# Patient Record
Sex: Male | Born: 1935 | Race: White | Hispanic: No | Marital: Married | Smoking: Never smoker
Health system: Southern US, Community
[De-identification: ages and names within clinical notes are randomized; demographics above are authoritative.]

## PROBLEM LIST (undated history)

## (undated) DIAGNOSIS — L80 Vitiligo: Secondary | ICD-10-CM

## (undated) DIAGNOSIS — I2510 Atherosclerotic heart disease of native coronary artery without angina pectoris: Secondary | ICD-10-CM

## (undated) DIAGNOSIS — E7800 Pure hypercholesterolemia, unspecified: Secondary | ICD-10-CM

## (undated) HISTORY — PX: CORONARY ARTERY BYPASS GRAFT: SHX141

---

## 2013-05-09 ENCOUNTER — Emergency Department (HOSPITAL_COMMUNITY): Payer: Medicare Other

## 2013-05-09 ENCOUNTER — Emergency Department (HOSPITAL_COMMUNITY)
Admission: EM | Admit: 2013-05-09 | Discharge: 2013-05-09 | Disposition: A | Discharge status: Home / Self Care | Payer: Medicare Other | Attending: Emergency Medicine | Admitting: Emergency Medicine

## 2013-05-09 ENCOUNTER — Encounter (HOSPITAL_COMMUNITY): Payer: Self-pay | Admitting: Emergency Medicine

## 2013-05-09 DIAGNOSIS — Z79899 Other long term (current) drug therapy: Secondary | ICD-10-CM | POA: Insufficient documentation

## 2013-05-09 DIAGNOSIS — E785 Hyperlipidemia, unspecified: Secondary | ICD-10-CM | POA: Diagnosis present

## 2013-05-09 DIAGNOSIS — K649 Unspecified hemorrhoids: Secondary | ICD-10-CM

## 2013-05-09 DIAGNOSIS — I2510 Atherosclerotic heart disease of native coronary artery without angina pectoris: Secondary | ICD-10-CM

## 2013-05-09 DIAGNOSIS — E7800 Pure hypercholesterolemia, unspecified: Secondary | ICD-10-CM | POA: Insufficient documentation

## 2013-05-09 DIAGNOSIS — Z951 Presence of aortocoronary bypass graft: Secondary | ICD-10-CM | POA: Insufficient documentation

## 2013-05-09 DIAGNOSIS — Z7982 Long term (current) use of aspirin: Secondary | ICD-10-CM | POA: Insufficient documentation

## 2013-05-09 DIAGNOSIS — I10 Essential (primary) hypertension: Secondary | ICD-10-CM

## 2013-05-09 DIAGNOSIS — K625 Hemorrhage of anus and rectum: Secondary | ICD-10-CM | POA: Diagnosis present

## 2013-05-09 HISTORY — DX: Atherosclerotic heart disease of native coronary artery without angina pectoris: I25.10

## 2013-05-09 HISTORY — DX: Pure hypercholesterolemia, unspecified: E78.00

## 2013-05-09 LAB — URINALYSIS, ROUTINE W REFLEX MICROSCOPIC
Ketones, ur: NEGATIVE mg/dL
Protein, ur: NEGATIVE mg/dL
Urobilinogen, UA: 0.2 mg/dL (ref 0.0–1.0)

## 2013-05-09 LAB — CBC WITH DIFFERENTIAL/PLATELET
Basophils Relative: 0 % (ref 0–1)
Eosinophils Absolute: 0.2 10*3/uL (ref 0.0–0.7)
MCH: 29.9 pg (ref 26.0–34.0)
MCHC: 33.5 g/dL (ref 30.0–36.0)
Neutrophils Relative %: 49 % (ref 43–77)
Platelets: 128 10*3/uL — ABNORMAL LOW (ref 150–400)
RDW: 13.3 % (ref 11.5–15.5)

## 2013-05-09 LAB — COMPREHENSIVE METABOLIC PANEL
ALT: 14 U/L (ref 0–53)
Albumin: 3.6 g/dL (ref 3.5–5.2)
Alkaline Phosphatase: 63 U/L (ref 39–117)
BUN: 16 mg/dL (ref 6–23)
Potassium: 4.1 mEq/L (ref 3.5–5.1)
Sodium: 139 mEq/L (ref 135–145)
Total Protein: 7 g/dL (ref 6.0–8.3)

## 2013-05-09 LAB — PROTIME-INR: Prothrombin Time: 14.3 seconds (ref 11.6–15.2)

## 2013-05-09 LAB — TYPE AND SCREEN
ABO/RH(D): O POS
Antibody Screen: NEGATIVE

## 2013-05-09 LAB — URINE MICROSCOPIC-ADD ON

## 2013-05-09 MED ORDER — DOCUSATE SODIUM 100 MG PO CAPS: 100.0000 mg | ORAL_CAPSULE | Freq: Two times a day (BID) | ORAL | Status: DC

## 2013-05-09 MED ORDER — IOHEXOL 300 MG/ML  SOLN
50.0000 mL | Freq: Once | INTRAMUSCULAR | Status: AC | PRN
  Administered 2013-05-09: 50 mL via ORAL

## 2013-05-09 MED ORDER — HYDROCORTISONE ACETATE 25 MG RE SUPP: 25.0000 mg | Freq: Two times a day (BID) | RECTAL | Status: DC

## 2013-05-09 MED ORDER — HYDROCORTISONE 2.5 % RE CREA: TOPICAL_CREAM | RECTAL | Status: DC

## 2013-05-09 MED ORDER — IOHEXOL 300 MG/ML  SOLN
100.0000 mL | Freq: Once | INTRAMUSCULAR | Status: AC | PRN
  Administered 2013-05-09: 100 mL via INTRAVENOUS

## 2013-05-09 MED ORDER — PANTOPRAZOLE SODIUM 40 MG IV SOLR
40.0000 mg | Freq: Once | INTRAVENOUS | Status: AC
  Administered 2013-05-09: 40 mg via INTRAVENOUS
  Filled 2013-05-09: qty 40

## 2013-05-09 MED ORDER — IOHEXOL 300 MG/ML  SOLN: 50.0000 mL | Freq: Once | INTRAMUSCULAR | Status: DC | PRN

## 2013-05-09 NOTE — ED Notes (Signed)
States that he started having rectal bleeding with bowel movements yesterday morning and this morning.  Denies abdominal pain.

## 2013-05-09 NOTE — ED Provider Notes (Signed)
History  This chart was scribed for Randy Octave, MD by Randy Edwards, ED Scribe. This patient was seen in room APA09/APA09 and the patient's care was started at 9:48 AM.  CSN: [id number]  Arrival date & time 05/09/13  8657    Chief Complaint  Patient presents with  . Rectal Bleeding    The history is provided by the patient. No language interpreter was used.    HPI Comments: Randy Edwards is a 79 y.o. male with h/o CAD and high cholesterol who presents to the Emergency Department complaining of rectal bleeding that started yesterday morning with bowel movements. He states about half a cup full of blood comes out before he passes stool. He states his stool is bright red. Pt denies fever, neck pain, sore throat, visual disturbance, CP, cough, SOB, abdominal pain, nausea, emesis, diarrhea, urinary symptoms, back pain, HA, weakness, numbness and rash as associated symptoms. He states his right leg has been swollen for years and he has had it looked at. Pt states he takes an aspirin everyday. He states he had a colonoscopy less than a year ago.  CABG 2 months ago.  On ASA, no plavix.  Past Medical History  Diagnosis Date  . Coronary artery disease   . High cholesterol     Past Surgical History  Procedure Laterality Date  . Coronary artery bypass graft      No family history on file.  History  Substance Use Topics  . Smoking status: Not on file  . Smokeless tobacco: Not on file  . Alcohol Use: Not on file      Review of Systems  A complete 10 system review of systems was obtained and all systems are negative except as noted in the HPI and PMH.   Allergies  Review of patient's allergies indicates no known allergies.  Home Medications   Current Outpatient Rx  Name  Route  Sig  Dispense  Refill  . atorvastatin (LIPITOR) 10 MG tablet   Oral   Take 10 mg by mouth at bedtime.         Marland Kitchen aspirin EC 81 MG tablet   Oral   Take 81 mg by mouth daily.         . calcium  carbonate (OS-CAL) 600 MG TABS   Oral   Take 600 mg by mouth daily.         Marland Kitchen docusate sodium (COLACE) 100 MG capsule   Oral   Take 100 mg by mouth daily as needed for constipation.         . docusate sodium (COLACE) 100 MG capsule   Oral   Take 1 capsule (100 mg total) by mouth 2 (two) times daily.   30 capsule   0   . docusate sodium (COLACE) 100 MG capsule   Oral   Take 1 capsule (100 mg total) by mouth every 12 (twelve) hours.   60 capsule   0   . hydrocortisone (ANUSOL-HC) 2.5 % rectal cream      Apply rectally 2 times daily   30 g   0   . hydrocortisone (ANUSOL-HC) 25 MG suppository   Rectal   Place 1 suppository (25 mg total) rectally 2 (two) times daily.   12 suppository   0   . metoprolol (LOPRESSOR) 50 MG tablet   Oral   Take 50 mg by mouth 2 (two) times daily.         . Multiple Vitamin (MULTIVITAMIN WITH MINERALS) TABS  Oral   Take 1 tablet by mouth daily.           BP 153/78  Pulse 60  Temp(Src) 97.7 F (36.5 C) (Oral)  Resp 20  Ht 5\' 11"  (1.803 m)  Wt 212 lb (96.163 kg)  BMI 29.58 kg/m2  SpO2 100%  Physical Exam  Nursing note and vitals reviewed. Constitutional: He is oriented to person, place, and time. He appears well-developed and well-nourished. No distress.  HENT:  Head: Normocephalic and atraumatic.  Eyes: EOM are normal.  Neck: Neck supple. No tracheal deviation present.  Cardiovascular: Normal rate.   Pulmonary/Chest: Effort normal. No respiratory distress.  Abdominal: Soft. He exhibits no mass. There is no tenderness.  Genitourinary:  Small external hemorrhoids that are non thrombosed. brown pink tinged stool.   Musculoskeletal: Normal range of motion.  Asymmetric swelling of right leg (chronic per pt).   Neurological: He is alert and oriented to person, place, and time.  Skin: Skin is warm and dry.  Psychiatric: He has a normal mood and affect. His behavior is normal.    ED Course  Procedures (including critical  care time)  DIAGNOSTIC STUDIES: Oxygen Saturation is 100% on RA, normal by my interpretation.    COORDINATION OF CARE: 9:53 AM-Discussed treatment plan with pt at bedside and pt agreed to plan.   12:13 PM- Upon recheck, alerted family of possible admit to hospital.   Labs Reviewed  CBC WITH DIFFERENTIAL - Abnormal; Notable for the following:    Platelets 128 (*)    Monocytes Relative 13 (*)    All other components within normal limits  COMPREHENSIVE METABOLIC PANEL - Abnormal; Notable for the following:    Glucose, Bld 103 (*)    GFR calc non Af Amer 68 (*)    GFR calc Af Amer 78 (*)    All other components within normal limits  URINALYSIS, ROUTINE W REFLEX MICROSCOPIC - Abnormal; Notable for the following:    Specific Gravity, Urine <1.005 (*)    Hgb urine dipstick TRACE (*)    All other components within normal limits  PROTIME-INR  TROPONIN I  URINE MICROSCOPIC-ADD ON  TYPE AND SCREEN   Ct Abdomen Pelvis W Contrast  05/09/2013   *RADIOLOGY REPORT*  Clinical Data: Rectal bleeding.  Recent heart surgery.  CT ABDOMEN AND PELVIS WITH CONTRAST  Technique:  Multidetector CT imaging of the abdomen and pelvis was performed following the standard protocol during bolus administration of intravenous contrast.  Contrast: 50mL OMNIPAQUE IOHEXOL 300 MG/ML  SOLN, OMNIPAQUE IOHEXOL 300 MG/ML  SOLN  Comparison: None.  Findings: Lung bases:  Clear lung bases.  Mild cardiomegaly without pericardial effusion. Prior median sternotomy.  Bilateral pleural calcifications and plaques.  Abdomen/pelvis:  Normal liver, spleen, stomach, pancreas, gallbladder, biliary tract, adrenal glands.  9 mm left renal cyst.  Normal right kidney.  Aortic atherosclerosis. No retroperitoneal or retrocrural adenopathy.  Scattered colonic diverticula.  Normal terminal ileum and appendix. Normal small bowel without abdominal ascites.  No pelvic adenopathy.  Normal urinary bladder.  Mild prostatomegaly. No significant free  fluid.  Bones/Musculoskeletal:  No acute osseous abnormality.  Mild T12 vertebral body height loss.  IMPRESSION:  1. No acute process in the abdomen or pelvis. 2.  Findings of asbestos related pleural disease. 3. Trace T12 vertebral body height loss, likely chronic.   Original Report Authenticated By: Jeronimo Greaves, M.D.   US Venous Img Lower Unilateral Right  05/09/2013   *RADIOLOGY REPORT*  Clinical Data: Right leg  swelling  RIGHT LOWER EXTREMITY VENOUS DUPLEX ULTRASOUND  Technique:  Gray-scale sonography with graded compression, as well as color Doppler and duplex ultrasound, were performed to evaluate the deep venous system of the lower extremity from the level of the common femoral vein through the popliteal and proximal calf veins. Spectral Doppler was utilized to evaluate flow at rest and with distal augmentation maneuvers.  Comparison:  None.  Findings: The visualized right lower extremity deep venous system appears patent.  Normal compressibility.  Patent color Doppler flow.  Satisfactory spectral Doppler with respiratory variation and response to augmentation.  The greater saphenous vein, where visualized, is patent and compressible.  Subcutaneous edema in the calf.  IMPRESSION: No deep venous thrombosis in the visualized right lower extremity.  Subcutaneous edema in the calf.   Original Report Authenticated By: Charline Bills, M.D.     1. Rectal bleeding   2. CAD (coronary artery disease)   3. Hyperlipidemia   4. Hypertension   5. Hemorrhoids       MDM  Bright red blood per rectum this morning followed by bloody bowel movement. Small bloody bowel movement yesterday. States stool is brown with red mixed in. No abdominal pain, chest pain or shortness of breath. No dizziness lightheadedness. Reportedly negative colonoscopy one year ago  Hemoglobin 13.5, no comparison. Vital stable, no orthostasis. INR normal.  Doppler negative for DVT.  Abdomen soft and nontender.  Suspect rectal  bleeding related to hemorrhoids.  Patient states colonoscopy one year ago was negative but this result is not available.  No tachycardia or hypotension. No dizziness, lightheadedness. D/w Dr. Kerry Hough who has consulted and agrees patient is stable for outpatient followup.  He is advised to call his GI doctor in [place] this week.  He has been advised to return to the ED if he has recurrent/persistent or worsening bleeding, lightheadedness, generalized weakness, chest pain or SOB.   Date: 05/09/2013  Rate: 59  Rhythm: normal sinus rhythm  QRS Axis: normal  Intervals: normal  ST/T Wave abnormalities: normal  Conduction Disutrbances:none  Narrative Interpretation:   Old EKG Reviewed: none available     ,I personally performed the services described in this documentation, which was scribed in my presence. The recorded information has been reviewed and is accurate. Randy Octave, MD 05/09/13 [phone]

## 2013-05-09 NOTE — Consult Note (Signed)
[facility] Medical Consultation  Randy Edwards ZOX:[id number] DOB: Jul 11, 1930 DOA: 05/09/2013 PCP: Marjean Donna, MD   Requesting physician: Dr. Manus Gunning Date of consultation: 05/09/13 Reason for consultation: rectal bleeding  Impression/Recommendations Active Problems:   Rectal bleeding    1. Rectal bleeding.  Rectal exam was done by EDP and it was noted that patient had small external hemorrhoids.  By history, it appears that patient has had bleeding from his hemorrhoids.  Of course, other etiologies such as diverticular bleeding may be possible, but less likely in this situation.  Patient is hemodynamically stable (not hypotensive or tachycardic).  He does not have any symptoms of anemia.  His hemoglobin is normal at 13.5.  Patient reports that he can schedule close follow up with his primary gastroenterologist in [place], and he has been advised to call him tomorrow.  At this time, it appears that patient has been reasonably screened in the ED and felt safe to discharge home.  He has been advised to return to the ED if he has recurrent/persistent or worsening bleeding, lightheadedness, generalized weakness. His bleeding is likely hemorrhoidal and he will be prescribed colace and anusol suppositories. He will follow up with his primary gastroenterologist later this week. 2. LE edema, chronic, venous dopplers negative for DVT.  This can be followed as an outpatient. 3. CAD s/p CABG 2 months ago.  Follow up with cardiologist.  No angina at this time. 4. HTN.  Stable on lopressor 5. HLP, continue statin   Chief Complaint: Rectal bleeding  HPI:  This is an 79 y/o gentleman who presents to the ED with complaints of rectal bleeding.  He reported that yesterday he was passing gas, when he noted some blood per rectum.  There was further blood, bright red, when he moved his bowels.  He did not have any further bleeding the rest of the day, and then today, he had another bowel movement that had  blood intermixed with stool.  He denies any abdominal pain.  No chest pain, shortness of breath, lightheadedness or generalized weakness.  He does not take NSAIDS.  He had a colonoscopy in [place] one year ago and per patient report, that was normal.  He reports a long history of hemorrhoids.  He has been referred for possible admission.  Review of Systems:  Pertinent positives as per HPI, otherwise negative  Past Medical History  Diagnosis Date  . Coronary artery disease   . High cholesterol    Past Surgical History  Procedure Laterality Date  . Coronary artery bypass graft     Social History:  has no tobacco, alcohol, and drug history on file.  No Known Allergies No family history on file.  Prior to Admission medications   Medication Sig Start Date End Date Taking? Authorizing Provider  atorvastatin (LIPITOR) 10 MG tablet Take 10 mg by mouth at bedtime.   Yes Historical Provider, MD  aspirin EC 81 MG tablet Take 81 mg by mouth daily.   Yes Historical Provider, MD  calcium carbonate (OS-CAL) 600 MG TABS Take 600 mg by mouth daily.   Yes Historical Provider, MD  docusate sodium (COLACE) 100 MG capsule Take 100 mg by mouth daily as needed for constipation.   Yes Historical Provider, MD  docusate sodium (COLACE) 100 MG capsule Take 1 capsule (100 mg total) by mouth 2 (two) times daily. 05/09/13   Erick Blinks, MD  hydrocortisone (ANUSOL-HC) 25 MG suppository Place 1 suppository (25 mg total) rectally 2 (two) times daily. 05/09/13  Erick Blinks, MD  metoprolol (LOPRESSOR) 50 MG tablet Take 50 mg by mouth 2 (two) times daily.   Yes Historical Provider, MD  Multiple Vitamin (MULTIVITAMIN WITH MINERALS) TABS Take 1 tablet by mouth daily.   Yes Historical Provider, MD   Physical Exam: Blood pressure 104/78, pulse 59, temperature 97.7 F (36.5 C), temperature source Oral, resp. rate 18, height 5\' 11"  (1.803 m), weight 96.163 kg (212 lb), SpO2 98.00%. Filed Vitals:   05/09/13 1021  05/09/13 1023 05/09/13 1031 05/09/13 1204  BP: 143/74 141/67 143/89 104/78  Pulse: 58 60 62 59  Temp:      TempSrc:      Resp:    18  Height:      Weight:      SpO2:    98%     General:  NAD  Eyes: PERRLA  ENT: mucous membranes are moist  Neck: supple  Cardiovascular: S1, S2 RRR  Respiratory: CTA B  Abdomen: soft, nt, nd, bs+  Skin: no rashes  Musculoskeletal: 1+ pedal edema R>L  Psychiatric: normal affect, co operative with exam  Neurologic: grossly intact, non focal  Labs on Admission:  Basic Metabolic Panel:  Recent Labs Lab 05/09/13 1005  NA 139  K 4.1  CL 103  CO2 30  GLUCOSE 103*  BUN 16  CREATININE 1.01  CALCIUM 9.4   Liver Function Tests:  Recent Labs Lab 05/09/13 1005  AST 18  ALT 14  ALKPHOS 63  BILITOT 0.5  PROT 7.0  ALBUMIN 3.6   No results found for this basename: LIPASE, AMYLASE,  in the last 168 hours No results found for this basename: AMMONIA,  in the last 168 hours CBC:  Recent Labs Lab 05/09/13 1005  WBC 6.0  NEUTROABS 3.0  HGB 13.5  HCT 40.3  MCV 89.2  PLT 128*   Cardiac Enzymes:  Recent Labs Lab 05/09/13 1005  TROPONINI <0.30   BNP: No components found with this basename: POCBNP,  CBG: No results found for this basename: GLUCAP,  in the last 168 hours  Radiological Exams on Admission: Ct Abdomen Pelvis W Contrast  05/09/2013   *RADIOLOGY REPORT*  Clinical Data: Rectal bleeding.  Recent heart surgery.  CT ABDOMEN AND PELVIS WITH CONTRAST  Technique:  Multidetector CT imaging of the abdomen and pelvis was performed following the standard protocol during bolus administration of intravenous contrast.  Contrast: 50mL OMNIPAQUE IOHEXOL 300 MG/ML  SOLN, OMNIPAQUE IOHEXOL 300 MG/ML  SOLN  Comparison: None.  Findings: Lung bases:  Clear lung bases.  Mild cardiomegaly without pericardial effusion. Prior median sternotomy.  Bilateral pleural calcifications and plaques.  Abdomen/pelvis:  Normal liver, spleen,  stomach, pancreas, gallbladder, biliary tract, adrenal glands.  9 mm left renal cyst.  Normal right kidney.  Aortic atherosclerosis. No retroperitoneal or retrocrural adenopathy.  Scattered colonic diverticula.  Normal terminal ileum and appendix. Normal small bowel without abdominal ascites.  No pelvic adenopathy.  Normal urinary bladder.  Mild prostatomegaly. No significant free fluid.  Bones/Musculoskeletal:  No acute osseous abnormality.  Mild T12 vertebral body height loss.  IMPRESSION:  1. No acute process in the abdomen or pelvis. 2.  Findings of asbestos related pleural disease. 3. Trace T12 vertebral body height loss, likely chronic.   Original Report Authenticated By: Jeronimo Greaves, M.D.   US Venous Img Lower Unilateral Right  05/09/2013   *RADIOLOGY REPORT*  Clinical Data: Right leg swelling  RIGHT LOWER EXTREMITY VENOUS DUPLEX ULTRASOUND  Technique:  Gray-scale sonography with graded compression,  as well as color Doppler and duplex ultrasound, were performed to evaluate the deep venous system of the lower extremity from the level of the common femoral vein through the popliteal and proximal calf veins. Spectral Doppler was utilized to evaluate flow at rest and with distal augmentation maneuvers.  Comparison:  None.  Findings: The visualized right lower extremity deep venous system appears patent.  Normal compressibility.  Patent color Doppler flow.  Satisfactory spectral Doppler with respiratory variation and response to augmentation.  The greater saphenous vein, where visualized, is patent and compressible.  Subcutaneous edema in the calf.  IMPRESSION: No deep venous thrombosis in the visualized right lower extremity.  Subcutaneous edema in the calf.   Original Report Authenticated By: Charline Bills, M.D.    Time spent:  Edwards,Randy [facility] Pager [phone]  If 7PM-7AM, please contact night-coverage [email or web address] Password [email or web address] 05/09/2013, 1:11 PM

## 2014-07-31 IMAGING — US US EXTREM LOW VENOUS*R*
1 series · 14 of 24 positions shown · non-contrast
Comparison: None.

CLINICAL DATA: Right leg swelling

RIGHT LOWER EXTREMITY VENOUS DUPLEX ULTRASOUND
TECHNIQUE: Gray-scale sonography with graded compression, as well
as color Doppler and duplex ultrasound, were performed to evaluate
the deep venous system of the lower extremity from the level of the
common femoral vein through the popliteal and proximal calf veins.
Spectral Doppler was utilized to evaluate flow at rest and with
distal augmentation maneuvers.

[Series 1: us extrem low venous*right* · 0.09mm/px · 14 of 38 slices shown]
[im 1/38]
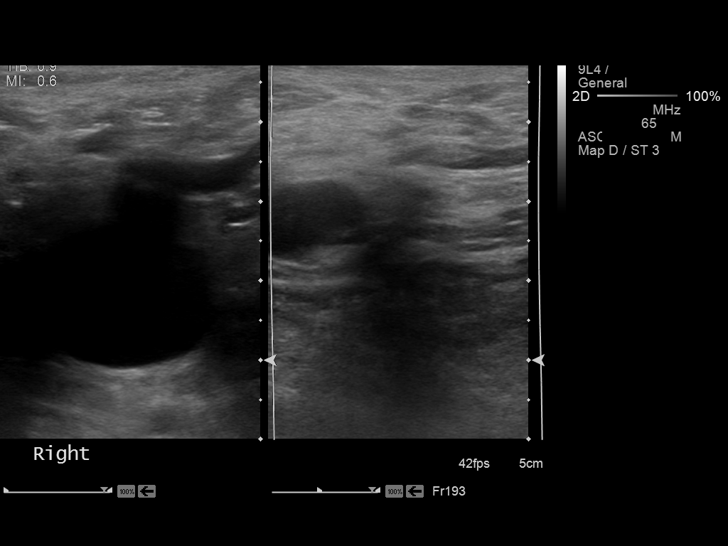
[im 4/38]
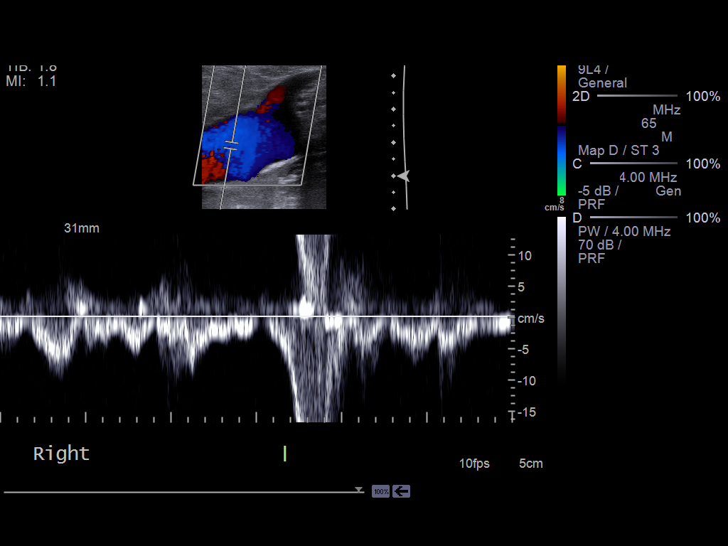
[im 7/38]
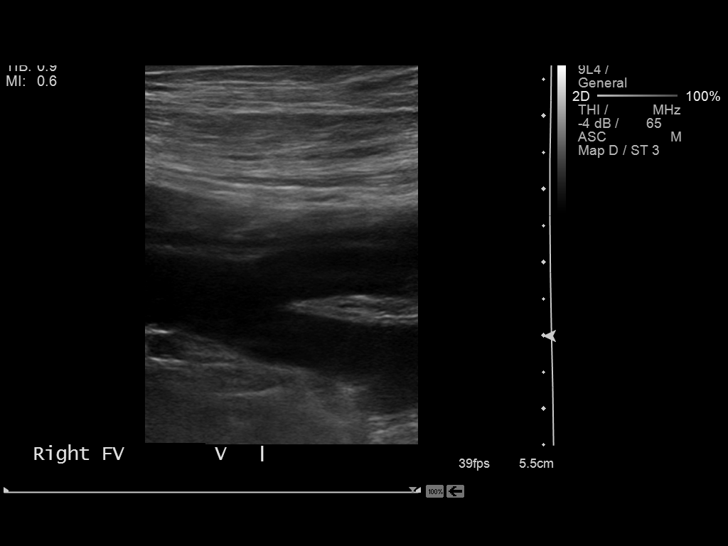
[im 10/38]
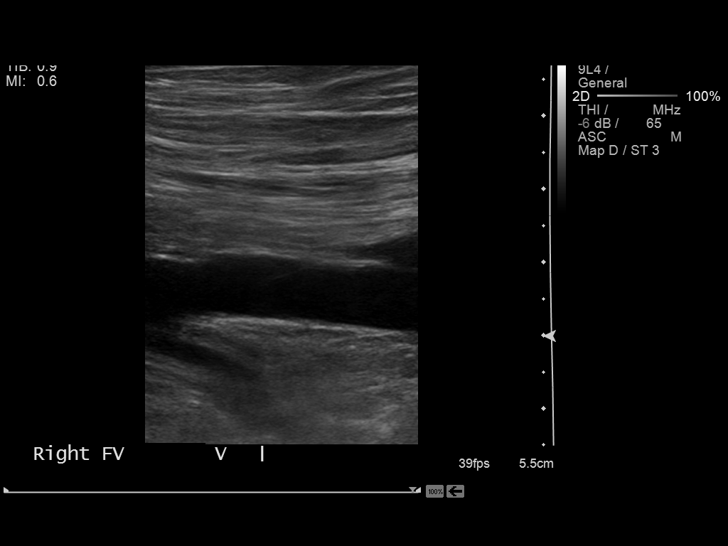
[im 12/38]
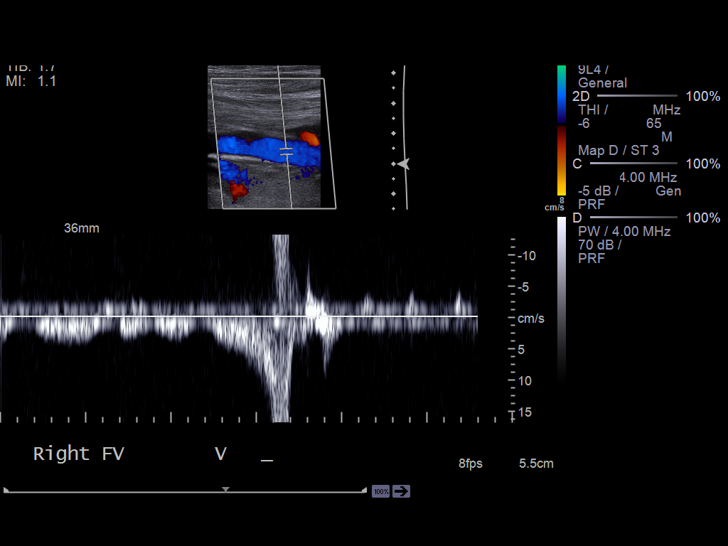
[im 15/38]
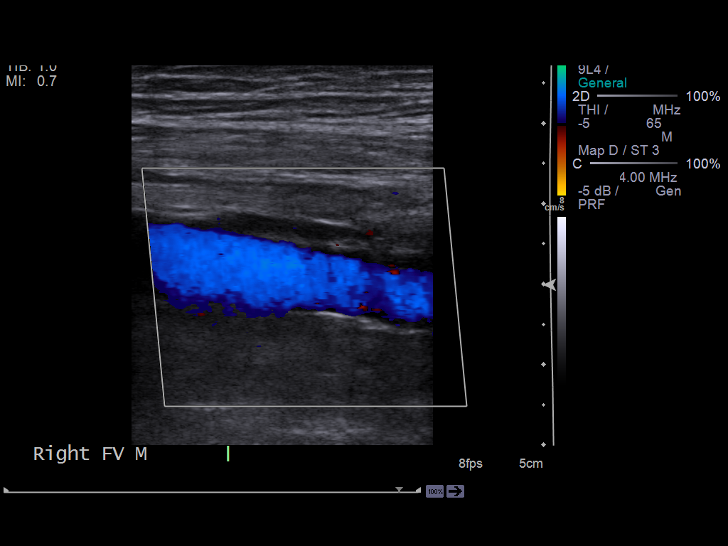
[im 18/38]
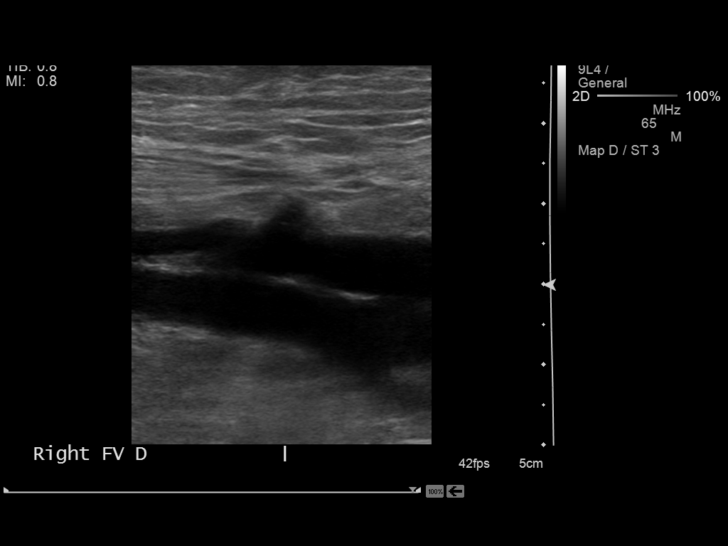
[im 20/38]
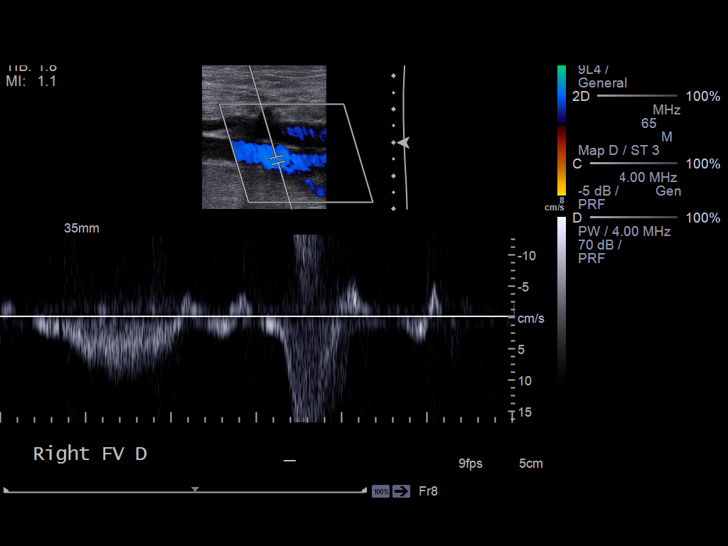
[im 23/38]
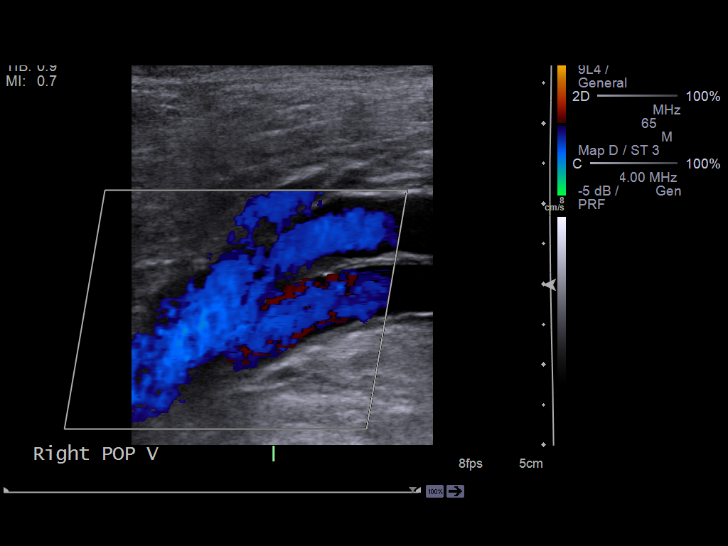
[im 26/38]
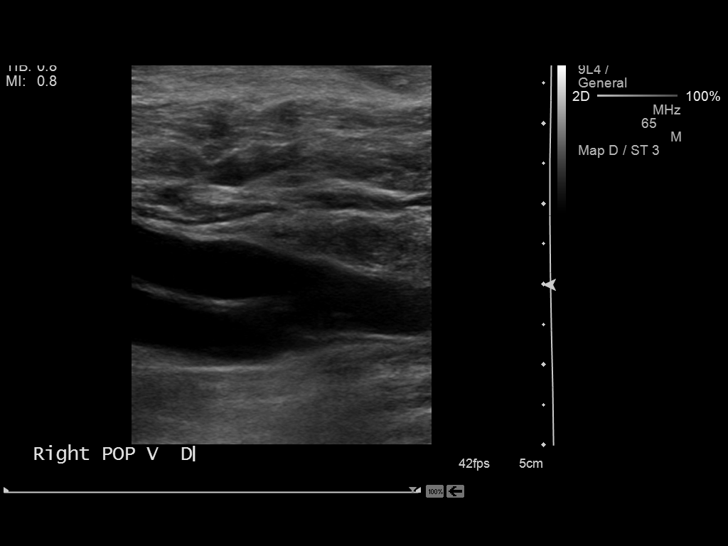
[im 29/38]
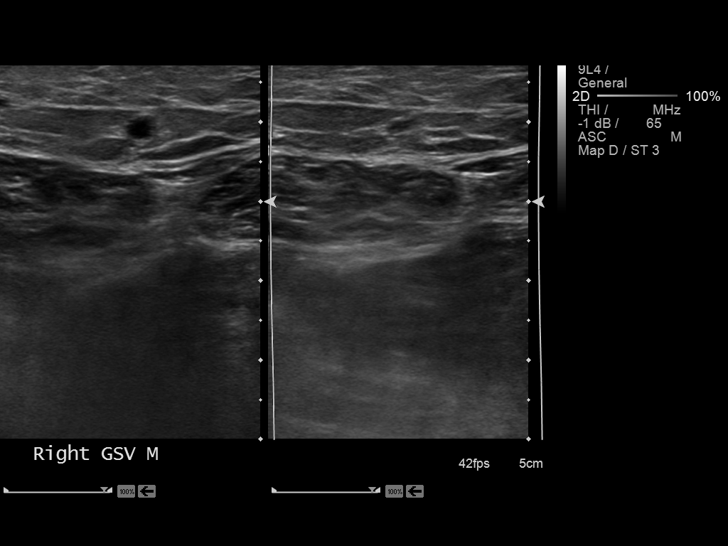
[im 31/38]
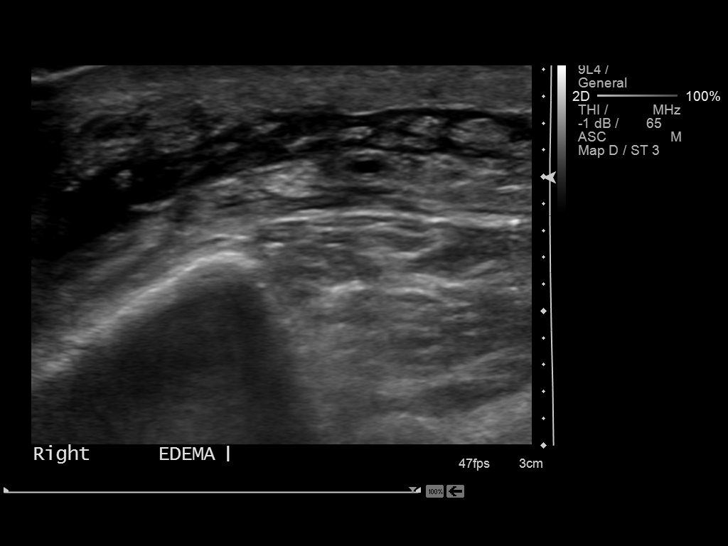
[im 34/38]
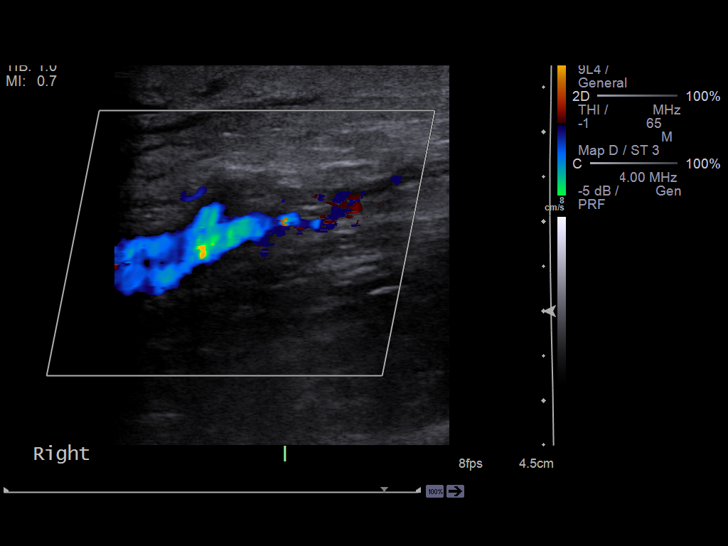
[im 38/38]
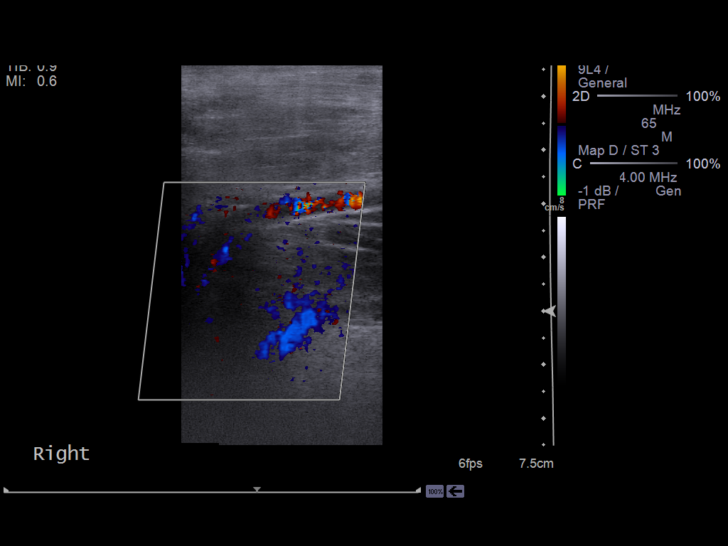

[14 of 24 positions shown; findings below may reference images not displayed]

FINDINGS: The visualized right lower extremity deep venous system
appears patent.

Normal compressibility.  Patent color Doppler flow.  Satisfactory
spectral Doppler with respiratory variation and response to
augmentation.

The greater saphenous vein, where visualized, is patent and
compressible.

Subcutaneous edema in the calf.
IMPRESSION: No deep venous thrombosis in the visualized right lower extremity.

Subcutaneous edema in the calf.

## 2014-07-31 IMAGING — CT CT ABD-PELV W/ CM
2 of 4 series · 16 of 46 positions shown, 18 images · IV contrast (Omnipaque 300)
Comparison: None.

CLINICAL DATA: Rectal bleeding.  Recent heart surgery.

CT ABDOMEN AND PELVIS WITH CONTRAST
TECHNIQUE: Multidetector CT imaging of the abdomen and pelvis was
performed following the standard protocol during bolus
administration of intravenous contrast.
Contrast: 50mL OMNIPAQUE IOHEXOL 300 MG/ML  SOLN, 100mL OMNIPAQUE
IOHEXOL 300 MG/ML  SOLN

[Series 2: abd_pel_with 5.0 b40f · axial · 0.78mm/px · z∈[-501,-36]mm · 13 of 103 slices shown, 15 images]
[im 5/103  soft-tissue]
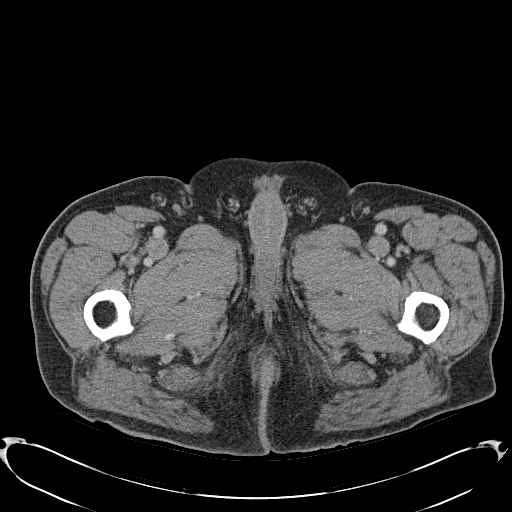
[im 5/103  bone]
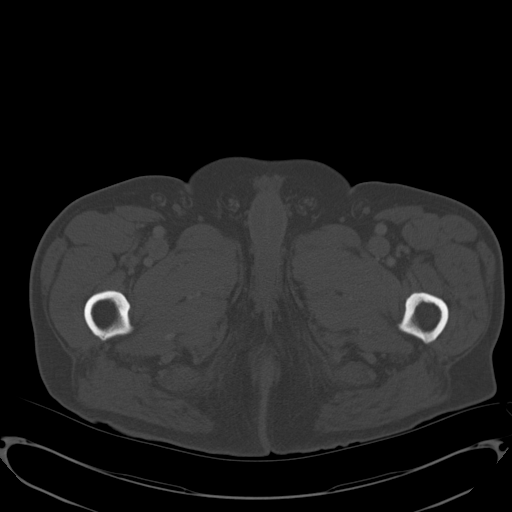
[im 15/103  soft-tissue]
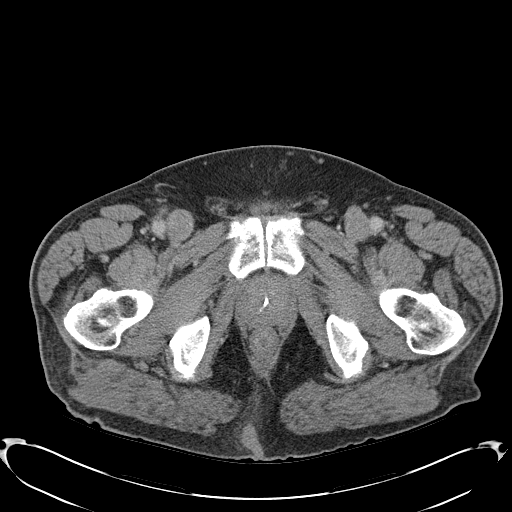
[im 20/103  soft-tissue]
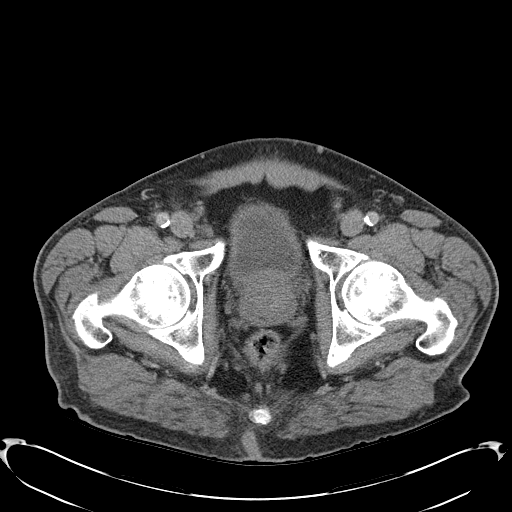
[im 30/103  soft-tissue]
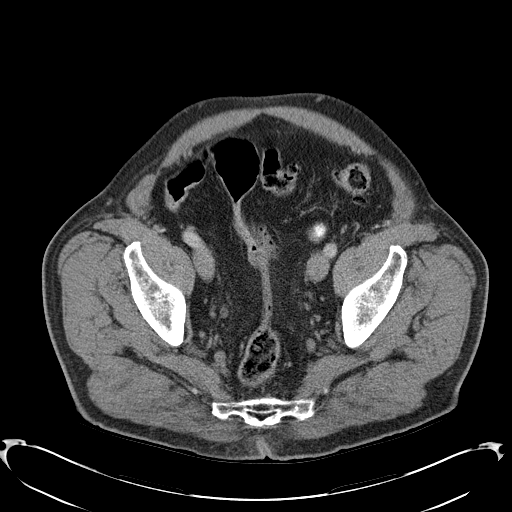
[im 35/103  soft-tissue]
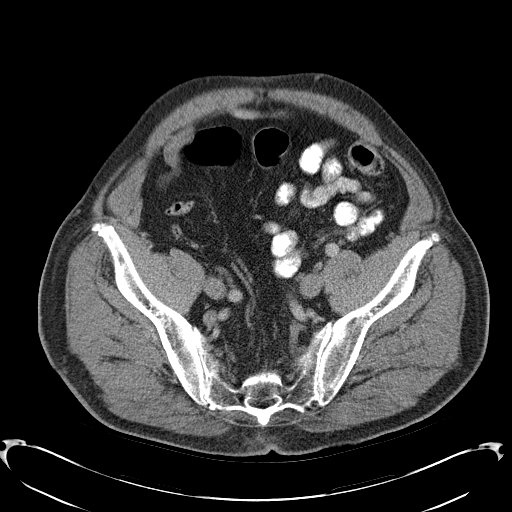
[im 44/103  soft-tissue]
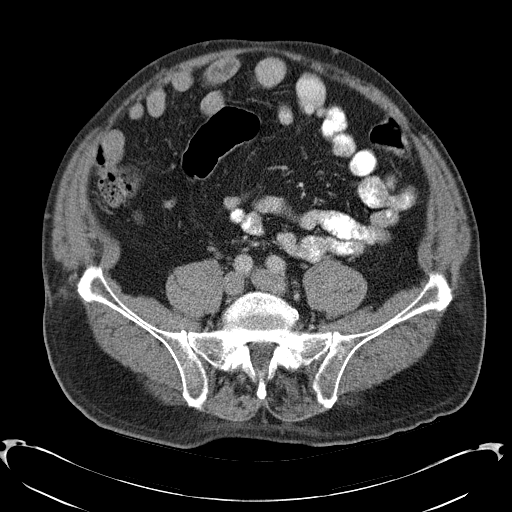
[im 54/103  soft-tissue]
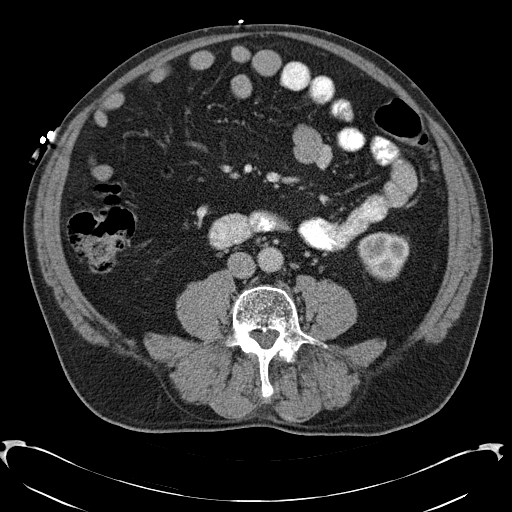
[im 59/103  soft-tissue]
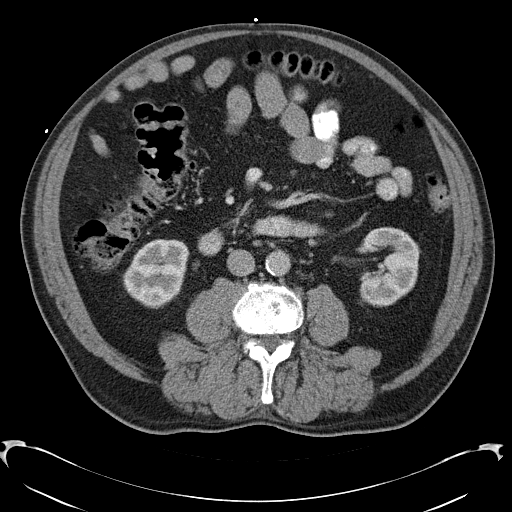
[im 69/103  soft-tissue]
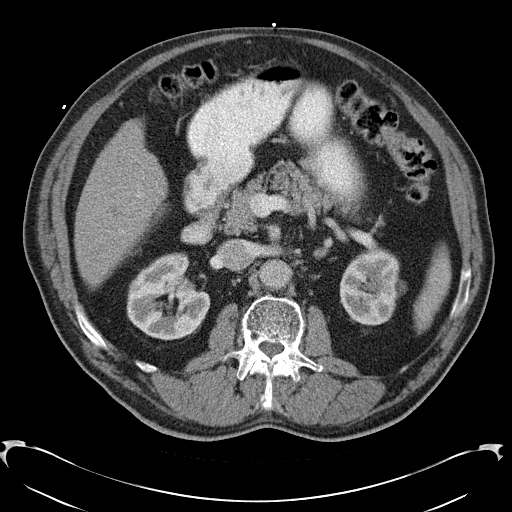
[im 69/103  bone]
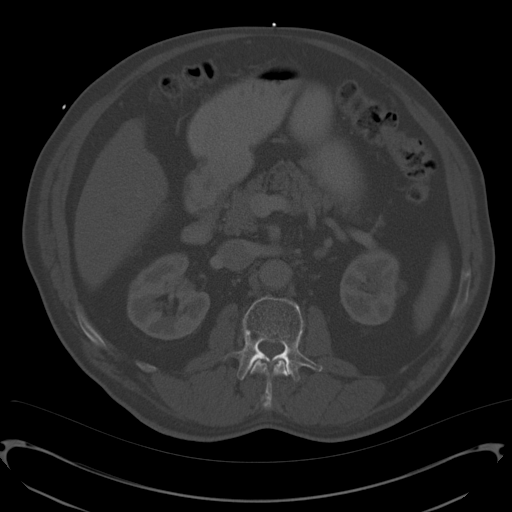
[im 73/103  soft-tissue]
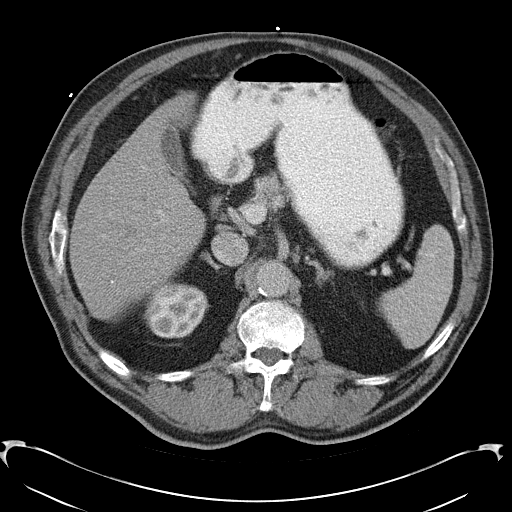
[im 83/103  soft-tissue]
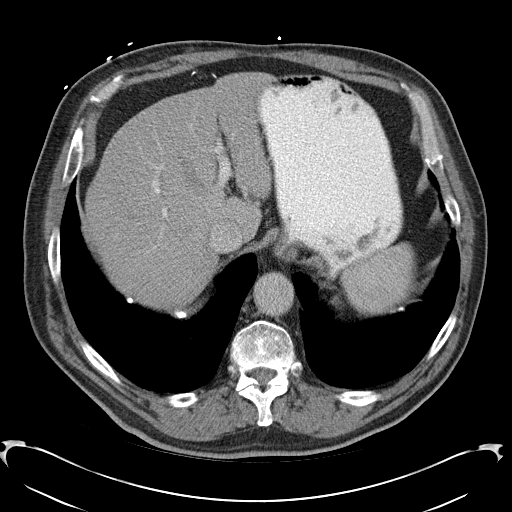
[im 88/103  soft-tissue]
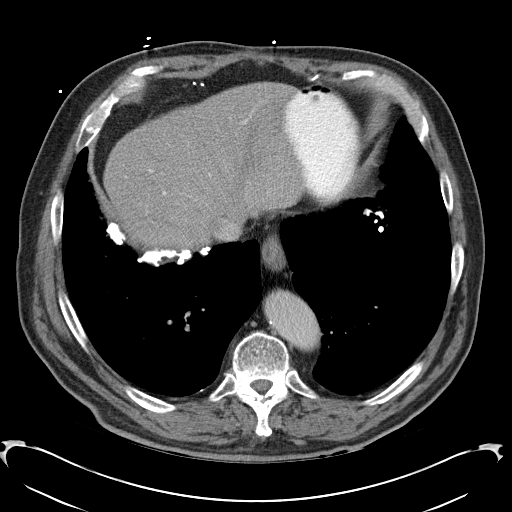
[im 98/103  soft-tissue]
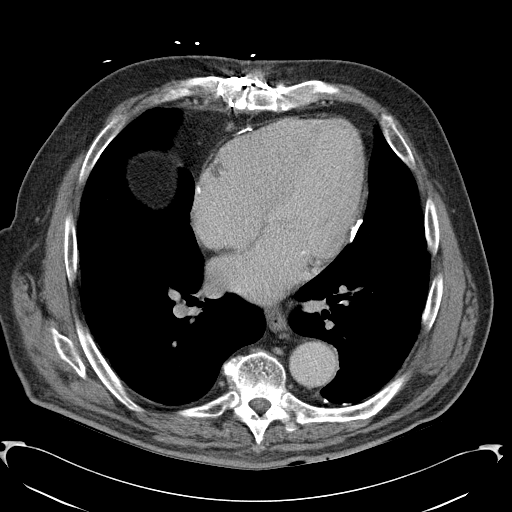

[Series 4: abd_pel_with 3.0 spo cor · coronal · 0.83mm/px · 3 of 106 slices shown]
[im 36/106  soft-tissue]
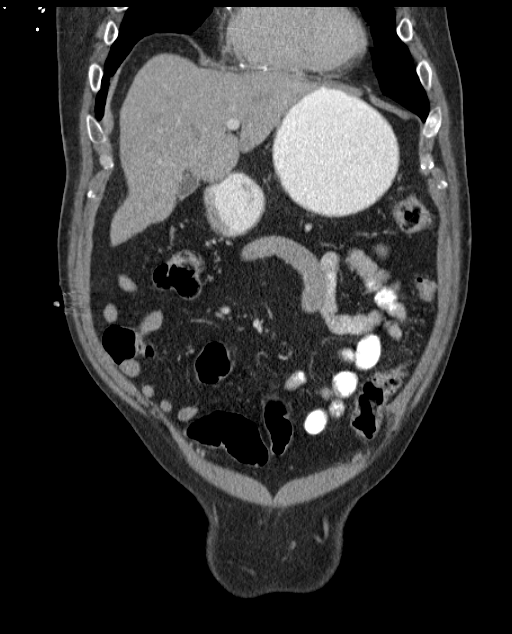
[im 47/106  soft-tissue]
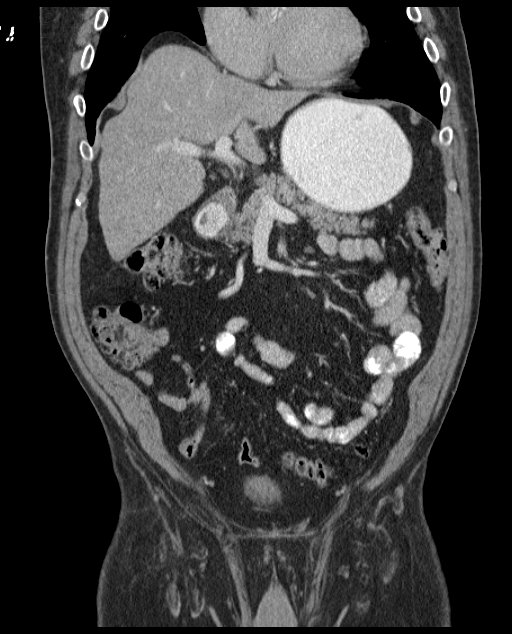
[im 59/106  soft-tissue]
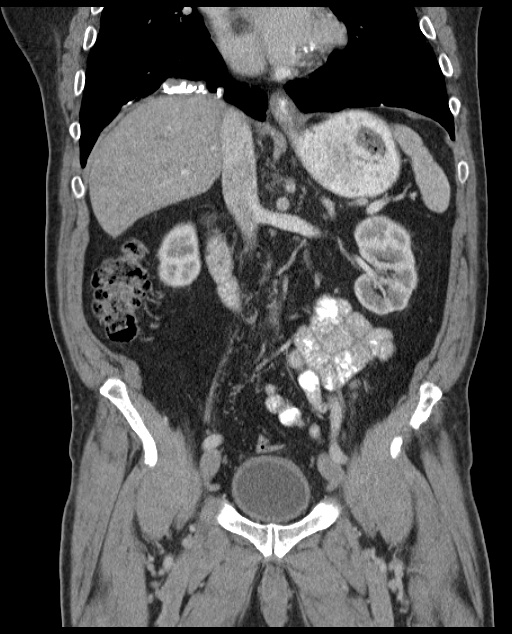

[16 of 46 positions shown; findings below may reference images not displayed]

FINDINGS: Lung bases:  Clear lung bases.  Mild cardiomegaly without
pericardial effusion. Prior median sternotomy.  Bilateral pleural
calcifications and plaques.

Abdomen/pelvis:  Normal liver, spleen, stomach, pancreas,
gallbladder, biliary tract, adrenal glands.

9 mm left renal cyst.  Normal right kidney.  Aortic
atherosclerosis. No retroperitoneal or retrocrural adenopathy.

Scattered colonic diverticula.  Normal terminal ileum and appendix.
Normal small bowel without abdominal ascites.

No pelvic adenopathy.  Normal urinary bladder.  Mild
prostatomegaly. No significant free fluid.

Bones/Musculoskeletal:  No acute osseous abnormality.  Mild T12
vertebral body height loss.
IMPRESSION: 1. No acute process in the abdomen or pelvis.
2.  Findings of asbestos related pleural disease.
3. Trace T12 vertebral body height loss, likely chronic.

## 2017-09-19 ENCOUNTER — Encounter (HOSPITAL_COMMUNITY): Payer: Self-pay | Admitting: Emergency Medicine

## 2017-09-19 ENCOUNTER — Inpatient Hospital Stay (HOSPITAL_COMMUNITY)
Admission: EM | Admit: 2017-09-19 | Discharge: 2017-09-22 | DRG: 603 | Disposition: A | Discharge status: Home / Self Care | Payer: Medicare Other | Attending: Internal Medicine | Admitting: Internal Medicine

## 2017-09-19 ENCOUNTER — Emergency Department (HOSPITAL_COMMUNITY): Payer: Medicare Other

## 2017-09-19 DIAGNOSIS — Z7982 Long term (current) use of aspirin: Secondary | ICD-10-CM

## 2017-09-19 DIAGNOSIS — E785 Hyperlipidemia, unspecified: Secondary | ICD-10-CM | POA: Diagnosis present

## 2017-09-19 DIAGNOSIS — D696 Thrombocytopenia, unspecified: Secondary | ICD-10-CM | POA: Diagnosis present

## 2017-09-19 DIAGNOSIS — I10 Essential (primary) hypertension: Secondary | ICD-10-CM | POA: Diagnosis present

## 2017-09-19 DIAGNOSIS — L03113 Cellulitis of right upper limb: Secondary | ICD-10-CM | POA: Diagnosis not present

## 2017-09-19 DIAGNOSIS — L80 Vitiligo: Secondary | ICD-10-CM | POA: Diagnosis present

## 2017-09-19 DIAGNOSIS — Z79899 Other long term (current) drug therapy: Secondary | ICD-10-CM

## 2017-09-19 DIAGNOSIS — Z951 Presence of aortocoronary bypass graft: Secondary | ICD-10-CM

## 2017-09-19 DIAGNOSIS — I2510 Atherosclerotic heart disease of native coronary artery without angina pectoris: Secondary | ICD-10-CM | POA: Diagnosis present

## 2017-09-19 HISTORY — DX: Vitiligo: L80

## 2017-09-19 LAB — CBC WITH DIFFERENTIAL/PLATELET
BASOS ABS: 0 10*3/uL (ref 0.0–0.1)
BASOS PCT: 0 %
Eosinophils Absolute: 0.1 10*3/uL (ref 0.0–0.7)
Eosinophils Relative: 1 %
HEMATOCRIT: 39.1 % (ref 39.0–52.0)
HEMOGLOBIN: 12.8 g/dL — AB (ref 13.0–17.0)
Lymphocytes Relative: 20 %
Lymphs Abs: 1.5 10*3/uL (ref 0.7–4.0)
MCH: 30.8 pg (ref 26.0–34.0)
MCHC: 32.7 g/dL (ref 30.0–36.0)
MCV: 94.2 fL (ref 78.0–100.0)
Monocytes Absolute: 1.5 10*3/uL — ABNORMAL HIGH (ref 0.1–1.0)
Monocytes Relative: 19 %
NEUTROS ABS: 4.4 10*3/uL (ref 1.7–7.7)
NEUTROS PCT: 59 %
Platelets: 96 10*3/uL — ABNORMAL LOW (ref 150–400)
RBC: 4.15 MIL/uL — ABNORMAL LOW (ref 4.22–5.81)
RDW: 12.4 % (ref 11.5–15.5)
WBC: 7.5 10*3/uL (ref 4.0–10.5)

## 2017-09-19 LAB — LACTATE DEHYDROGENASE: LDH: 166 U/L (ref 98–192)

## 2017-09-19 LAB — BASIC METABOLIC PANEL
ANION GAP: 7 (ref 5–15)
BUN: 20 mg/dL (ref 6–20)
CALCIUM: 9.1 mg/dL (ref 8.9–10.3)
CO2: 30 mmol/L (ref 22–32)
Chloride: 99 mmol/L — ABNORMAL LOW (ref 101–111)
Creatinine, Ser: 0.99 mg/dL (ref 0.61–1.24)
GFR calc non Af Amer: >60 mL/min (ref >60)
Glucose, Bld: 104 mg/dL — ABNORMAL HIGH (ref 65–99)
Potassium: 4.4 mmol/L (ref 3.5–5.1)
SODIUM: 136 mmol/L (ref 135–145)

## 2017-09-19 LAB — SAVE SMEAR

## 2017-09-19 LAB — SEDIMENTATION RATE: SED RATE: 25 mm/h — AB (ref 0–16)

## 2017-09-19 MED ORDER — MICONAZOLE NITRATE 2 % EX CREA
TOPICAL_CREAM | Freq: Two times a day (BID) | CUTANEOUS | Status: DC
  Filled 2017-09-19: qty 14

## 2017-09-19 MED ORDER — CALCIUM CARBONATE 1250 (500 CA) MG PO TABS
1250.0000 mg | ORAL_TABLET | Freq: Every day | ORAL | Status: DC
  Administered 2017-09-20 – 2017-09-22 (×3): 1250 mg via ORAL
  Filled 2017-09-19 (×3): qty 1

## 2017-09-19 MED ORDER — VANCOMYCIN HCL IN DEXTROSE 1-5 GM/200ML-% IV SOLN
1000.0000 mg | Freq: Once | INTRAVENOUS | Status: AC
  Administered 2017-09-19: 1000 mg via INTRAVENOUS
  Filled 2017-09-19: qty 200

## 2017-09-19 MED ORDER — VITAMIN D 1000 UNITS PO TABS
1000.0000 [IU] | ORAL_TABLET | Freq: Every day | ORAL | Status: DC
  Administered 2017-09-20 – 2017-09-22 (×3): 1000 [IU] via ORAL
  Filled 2017-09-19 (×3): qty 1

## 2017-09-19 MED ORDER — ACETAMINOPHEN 325 MG PO TABS: 650.0000 mg | ORAL_TABLET | Freq: Four times a day (QID) | ORAL | Status: DC | PRN

## 2017-09-19 MED ORDER — ADULT MULTIVITAMIN W/MINERALS CH
1.0000 | ORAL_TABLET | Freq: Every day | ORAL | Status: DC
  Administered 2017-09-20 – 2017-09-22 (×3): 1 via ORAL
  Filled 2017-09-19 (×3): qty 1

## 2017-09-19 MED ORDER — ASPIRIN EC 81 MG PO TBEC
81.0000 mg | DELAYED_RELEASE_TABLET | Freq: Every day | ORAL | Status: DC
  Administered 2017-09-20 – 2017-09-22 (×3): 81 mg via ORAL
  Filled 2017-09-19 (×3): qty 1

## 2017-09-19 MED ORDER — TETRAHYDROZOLINE HCL 0.05 % OP SOLN
1.0000 [drp] | Freq: Every day | OPHTHALMIC | Status: DC | PRN
  Filled 2017-09-19: qty 15

## 2017-09-19 MED ORDER — ACETAMINOPHEN 650 MG RE SUPP: 650.0000 mg | Freq: Four times a day (QID) | RECTAL | Status: DC | PRN

## 2017-09-19 MED ORDER — METOPROLOL TARTRATE 50 MG PO TABS
50.0000 mg | ORAL_TABLET | Freq: Two times a day (BID) | ORAL | Status: DC
  Administered 2017-09-19 – 2017-09-22 (×6): 50 mg via ORAL
  Filled 2017-09-19 (×6): qty 1

## 2017-09-19 MED ORDER — VANCOMYCIN HCL 10 G IV SOLR
1500.0000 mg | INTRAVENOUS | Status: DC
  Administered 2017-09-20 – 2017-09-21 (×2): 1500 mg via INTRAVENOUS
  Filled 2017-09-19 (×3): qty 1500

## 2017-09-19 MED ORDER — ZOLPIDEM TARTRATE 5 MG PO TABS
5.0000 mg | ORAL_TABLET | Freq: Every evening | ORAL | Status: DC | PRN
  Administered 2017-09-19: 5 mg via ORAL
  Filled 2017-09-19: qty 1

## 2017-09-19 MED ORDER — OXYCODONE-ACETAMINOPHEN 5-325 MG PO TABS: 1.0000 | ORAL_TABLET | Freq: Four times a day (QID) | ORAL | Status: DC | PRN

## 2017-09-19 MED ORDER — SIMVASTATIN 20 MG PO TABS
20.0000 mg | ORAL_TABLET | Freq: Every day | ORAL | Status: DC
  Administered 2017-09-20 – 2017-09-21 (×2): 20 mg via ORAL
  Filled 2017-09-19 (×2): qty 1

## 2017-09-19 MED ORDER — HYDRALAZINE HCL 20 MG/ML IJ SOLN: 5.0000 mg | INTRAMUSCULAR | Status: DC | PRN

## 2017-09-19 MED ORDER — ONDANSETRON HCL 4 MG/2ML IJ SOLN: 4.0000 mg | Freq: Three times a day (TID) | INTRAMUSCULAR | Status: DC | PRN

## 2017-09-19 NOTE — Progress Notes (Signed)
Pharmacy Antibiotic Note  Randy Edwards is a 83 y.o. male admitted on 09/19/2017 with cellulitis.  Pharmacy has been consulted for vancomycin dosing. One gram vanc given in ED.  Plan: Give additional 1 gm vanc for total loading dose of 2 gm then start Vancomycin 1500 mg IV q24 hours F/u renal function, cultures and clinical course  Height:  (177.8 cm) Weight: 208 lb (94.3 kg) IBW/kg (Calculated) : 73  Temp (24hrs), Avg:98.4 F (36.9 C), Min:98.4 F (36.9 C), Max:98.4 F (36.9 C)   Recent Labs Lab 09/19/17 1351  WBC 7.5  CREATININE 0.99    Estimated Creatinine Clearance: 62.9 mL/min (by C-G formula based on SCr of 0.99 mg/dL).    No Known Allergies  Thank you for allowing pharmacy to be a part of this patient's care.  Woodfin Ganja 09/19/2017 6:46 PM

## 2017-09-19 NOTE — ED Notes (Signed)
Pt updated about wait time, states understanding. Denies further needs.

## 2017-09-19 NOTE — H&P (Addendum)
History and Physical    Randy Edwards EYC:[id number] DOB: 11-01-1930 DOA: 09/19/2017  Referring MD/NP/PA:   PCP: System, Provider Not In   Patient coming from:  The patient is coming from home.  At baseline, pt is independent for most of ADL.   Chief Complaint: right arm swelling, erythema and warmth  HPI: Randy Edwards is a 83 y.o. male with medical history significant of hyperlipidemia, CAD, s/p of CABG, rectal bleeding, vitiligo, who presents with right arm swelling, erythema and warmth.  Pt states that he noted right swelling and erythema 3 days ago, which has been progressively getting worse  He feels warmer in right arm than in the left. Does not have significant pain. He has mild subjective fever and chills. He denies any injury. No insect bites recently. He states the redness initially started in his right wrist, then proceeded to extend into his forearm, and now has red streaks up lateral surface of the arm. Patient denies chest pain, shortness breath, cough, nausea, vomiting, diarrhea, abdominal pain, symptoms of a UTI or unilateral weakness.  ED Course: pt was found to have WBC 7.5, electrolytes renal function okay, temperature normal, no tachycardia, also sedimentation rate 100% on room air. Upper extremity venous Doppler is negative for DVT. Patient is placed on MedSurg bed for observation  Review of Systems:   General: Has a subjective fevers, chills, no body weight gain, has poor appetite, has fatigue HEENT: no blurry vision, hearing changes or sore throat Respiratory: no dyspnea, coughing, wheezing CV: no chest pain, no palpitations GI: no nausea, vomiting, abdominal pain, diarrhea, constipation GU: no dysuria, burning on urination, increased urinary frequency, hematuria  Ext: no leg edema Neuro: no unilateral weakness, numbness, or tingling, no vision change or hearing loss Skin: has vitiligo. Has erythema and swelling in right arm MSK: No muscle spasm, no deformity, no  limitation of range of movement in spin Heme: No easy bruising.  Travel history: No recent long distant travel.  Allergy: No Known Allergies  Past Medical History:  Diagnosis Date  . Coronary artery disease   . High cholesterol   . Vitiligo     Past Surgical History:  Procedure Laterality Date  . CORONARY ARTERY BYPASS GRAFT      Social History:  reports that he has never smoked. He has never used smokeless tobacco. He reports that he does not drink alcohol or use drugs.  Family History:  Family History  Problem Relation Age of Onset  . Ovarian cancer Mother   . Multiple myeloma Sister      Prior to Admission medications   Medication Sig Start Date End Date Taking? Authorizing Provider  aspirin EC 81 MG tablet Take 81 mg by mouth daily.   Yes [provider]  calcium carbonate (OS-CAL) 600 MG TABS Take 600 mg by mouth daily.   Yes [provider]  Cholecalciferol (VITAMIN D PO) Take 1 tablet by mouth.   Yes [provider]  metoprolol (LOPRESSOR) 50 MG tablet Take 50 mg by mouth 2 (two) times daily.   Yes [provider]  Multiple Vitamin (MULTIVITAMIN WITH MINERALS) TABS Take 1 tablet by mouth daily.   Yes [provider]  simvastatin (ZOCOR) 20 MG tablet Take 1 tablet by mouth.   Yes [provider]  Tetrahydrozoline HCl (VISINE OP) Apply 1 drop to eye daily as needed (eye irritation).   Yes [provider]    Physical Exam: Vitals:   09/19/17 1333 09/19/17 1536 09/19/17 1811  09/19/17 1812  BP:  137/73 118/66   Pulse:  62 69 69  Resp:  20    Temp:      SpO2:  100% 95% 97%  Weight: 94.3 kg (208 lb)     Height: _0  (1.778 m)      General: Not in acute distress HEENT:       Eyes: PERRL, EOMI, no scleral icterus.       ENT: No discharge from the ears and nose, no pharynx injection, no tonsillar enlargement.        Neck: No JVD, no bruit, no mass felt. Heme: No neck lymph node enlargement. Cardiac:  S1/S2, RRR, No murmurs, No gallops or rubs. Respiratory: No rales, wheezing, rhonchi or rubs. GI: Soft, nondistended, nontender, no rebound pain, no organomegaly, BS present. GU: No hematuria Ext: 1+ pitting leg edema bilaterally. 2+DP/PT pulse bilaterally. Musculoskeletal: No joint deformities, No joint redness or warmth, no limitation of ROM in spin. Skin: has erythema, swelling and redness in right arm, including dorsum of the right wristwrist, forearm, extending to lateral surface of the arm. No definite axillary lymphadenopathy. Good distal cap refill.   Neuro: Alert, oriented X3, cranial nerves II-XII grossly intact, moves all extremities normally.  Psych: Patient is not psychotic, no suicidal or hemocidal ideation.  Labs on Admission: I have personally reviewed following labs and imaging studies  CBC:  Recent Labs Lab 09/19/17 1351  WBC 7.5  NEUTROABS 4.4  HGB 12.8*  HCT 39.1  MCV 94.2  PLT 96*   Basic Metabolic Panel:  Recent Labs Lab 09/19/17 1351  NA 136  K 4.4  CL 99*  CO2 30  GLUCOSE 104*  BUN 20  CREATININE 0.99  CALCIUM 9.1   GFR: Estimated Creatinine Clearance: 62.9 mL/min (by C-G formula based on SCr of 0.99 mg/dL). Liver Function Tests: No results for input(s): AST, ALT, ALKPHOS, BILITOT, PROT, ALBUMIN in the last 168 hours. No results for input(s): LIPASE, AMYLASE in the last 168 hours. No results for input(s): AMMONIA in the last 168 hours. Coagulation Profile: No results for input(s): INR, PROTIME in the last 168 hours. Cardiac Enzymes: No results for input(s): CKTOTAL, CKMB, CKMBINDEX, TROPONINI in the last 168 hours. BNP (last 3 results) No results for input(s): PROBNP in the last 8760 hours. HbA1C: No results for input(s): HGBA1C in the last 72 hours. CBG: No results for input(s): GLUCAP in the last 168 hours. Lipid Profile: No results for input(s): CHOL, HDL, LDLCALC, TRIG, CHOLHDL, LDLDIRECT in the last 72 hours. Thyroid Function  Tests: No results for input(s): TSH, T4TOTAL, FREET4, T3FREE, THYROIDAB in the last 72 hours. Anemia Panel: No results for input(s): VITAMINB12, FOLATE, FERRITIN, TIBC, IRON, RETICCTPCT in the last 72 hours. Urine analysis:    Component Value Date/Time   COLORURINE YELLOW 05/09/2013 1019   APPEARANCEUR CLEAR 05/09/2013 1019   LABSPEC <1.005 (L) 05/09/2013 1019   PHURINE 7.0 05/09/2013 1019   GLUCOSEU NEGATIVE 05/09/2013 1019   HGBUR TRACE (A) 05/09/2013 1019   BILIRUBINUR NEGATIVE 05/09/2013 1019   KETONESUR NEGATIVE 05/09/2013 1019   PROTEINUR NEGATIVE 05/09/2013 1019   UROBILINOGEN 0.2 05/09/2013 1019   NITRITE NEGATIVE 05/09/2013 1019   LEUKOCYTESUR NEGATIVE 05/09/2013 1019   Sepsis Labs: _1 (procalcitonin:4,lacticidven:4) )No results found for this or any previous visit (from the past 240 hour(s)).   Radiological Exams on Admission: US Venous Img Upper Uni Left  Result Date: 09/19/2017 CLINICAL DATA:  Right upper extremity edema for the past 3 days. Evaluate for  DVT. EXAM: RIGHT UPPER EXTREMITY VENOUS DOPPLER ULTRASOUND TECHNIQUE: Gray-scale sonography with graded compression, as well as color Doppler and duplex ultrasound were performed to evaluate the upper extremity deep venous system from the level of the subclavian vein and including the jugular, axillary, basilic, radial, ulnar and upper cephalic vein. Spectral Doppler was utilized to evaluate flow at rest and with distal augmentation maneuvers. COMPARISON:  None. FINDINGS: Contralateral Subclavian Vein: Respiratory phasicity is normal and symmetric with the symptomatic side. No evidence of thrombus. Normal compressibility. Internal Jugular Vein: No evidence of thrombus. Normal compressibility, respiratory phasicity and response to augmentation. Subclavian Vein: No evidence of thrombus. Normal compressibility, respiratory phasicity and response to augmentation. Axillary Vein: No evidence of thrombus. Normal  compressibility, respiratory phasicity and response to augmentation. Cephalic Vein: No evidence of thrombus. Normal compressibility, respiratory phasicity and response to augmentation. Basilic Vein: No evidence of thrombus. Normal compressibility, respiratory phasicity and response to augmentation. Brachial Veins: No evidence of thrombus. Normal compressibility, respiratory phasicity and response to augmentation. Radial Veins: No evidence of thrombus. Normal compressibility, respiratory phasicity and response to augmentation. Ulnar Veins: No evidence of thrombus. Normal compressibility, respiratory phasicity and response to augmentation. Venous Reflux:  None visualized. Other Findings: A minimal subcutaneous edema is noted at the level of the forearm. IMPRESSION: No evidence of DVT within the right upper extremity. Electronically Signed   By: Sandi Mariscal M.D.   On: 09/19/2017 17:16     EKG:   Not done in ED, will get one.   Assessment/Plan Principal Problem:   Cellulitis of right arm Active Problems:   Hyperlipidemia   Hypertension   CAD (coronary artery disease)   Thrombocytopenia (HCC)   Cellulitis of right arm: The patient's left arm is erythematous, red and warm, consistent with possible cellulitis. Clinically patient is not septic. No leukocytosis or fever. Currently hemodynamically stable. UE doppler is negative for DVT. It is unusual that pt dose not has significant pain, cannot completely rule out fungal infection  - will place on med-surg bed for obs - Empiric antimicrobial treatment with vancomycin started in ED-->will contine - PRN Zofran for nausea, and Percocet for pain - Blood cultures x 2  - ESR and CRP - start Miconazole cream emirically.  HLD: -zocor  HTN: -Continue metoprolol -IV hydralazine when necessary  CAD (coronary artery disease): s/p of CABG, no CP. -Continue aspirin, metoprolol, Zocor.  Thrombocytopenia ([place]): platelet 96. Patient had platelet 128 on  05/09/13. Etiology is not clear. Mental status normal. Less likely to TTP -f/u by CBC -check LDH and peripheral smear    DVT ppx: SCD Code Status: Full code Family Communication:   Yes, patient's  wife  at bed side Disposition Plan:  Anticipate discharge back to previous home environment Consults called:  none Admission status:   medical floor/obs    Date of Service 09/19/2017    Ivor Costa [facility] Pager [phone]  If 7PM-7AM, please contact night-coverage [email or web address] Password [email or web address] 09/19/2017, 8:13 PM

## 2017-09-19 NOTE — ED Provider Notes (Signed)
[facility]-EMERGENCY DEPT Provider Note   CSN: [id number] Arrival date & time: 09/19/17  1314     History   Chief Complaint Chief Complaint  Patient presents with  . Arm Injury    HPI Randy Edwards is a 83 y.o. male.  HPI Patient presents with right arm swelling, redness for the past 3 days. He's had low-grade fever and fatigue. States the redness started in his wrist and his proceeded to extend into his forearm and now has red streaks up the inner arm. Denies any known injury, bug or tick bites.  Past Medical History:  Diagnosis Date  . Coronary artery disease   . High cholesterol     Patient Active Problem List   Diagnosis Date Noted  . Thrombocytopenia (HCC) 09/19/2017  . Cellulitis of right arm 09/19/2017  . Rectal bleeding 05/09/2013  . Hyperlipidemia 05/09/2013  . Hypertension 05/09/2013  . CAD (coronary artery disease) 05/09/2013    Past Surgical History:  Procedure Laterality Date  . CORONARY ARTERY BYPASS GRAFT         Home Medications    Prior to Admission medications   Medication Sig Start Date End Date Taking? Authorizing Provider  aspirin EC 81 MG tablet Take 81 mg by mouth daily.   Yes [provider]  calcium carbonate (OS-CAL) 600 MG TABS Take 600 mg by mouth daily.   Yes [provider]  Cholecalciferol (VITAMIN D PO) Take 1 tablet by mouth.   Yes [provider]  metoprolol (LOPRESSOR) 50 MG tablet Take 50 mg by mouth 2 (two) times daily.   Yes [provider]  Multiple Vitamin (MULTIVITAMIN WITH MINERALS) TABS Take 1 tablet by mouth daily.   Yes [provider]  simvastatin (ZOCOR) 20 MG tablet Take 1 tablet by mouth.   Yes [provider]  Tetrahydrozoline HCl (VISINE OP) Apply 1 drop to eye daily as needed (eye irritation).   Yes [provider]    Family History No family history on file.  Social History Social History  Substance Use Topics  . Smoking status: Never Smoker  .  Smokeless tobacco: Never Used  . Alcohol use No     Allergies   Patient has no known allergies.   Review of Systems Review of Systems  Constitutional: Positive for chills, fatigue and fever.  Respiratory: Negative for cough and shortness of breath.   Cardiovascular: Negative for chest pain.  Gastrointestinal: Negative for abdominal pain, nausea and vomiting.  Musculoskeletal: Positive for myalgias.  Skin: Positive for color change and rash.  Neurological: Negative for dizziness, weakness, light-headedness, numbness and headaches.  All other systems reviewed and are negative.    Physical Exam Updated Vital Signs BP 137/73 (BP Location: Left Arm)   Pulse 62   Temp 98.4 F (36.9 C)   Resp 20   Ht  (1.778 m)   Wt 94.3 kg (208 lb)   SpO2 100%   BMI 29.84 kg/m   Physical Exam  Constitutional: He is oriented to person, place, and time. He appears well-developed and well-nourished. No distress.  HENT:  Head: Normocephalic and atraumatic.  Mouth/Throat: Oropharynx is clear and moist. No oropharyngeal exudate.  Eyes: Pupils are equal, round, and reactive to light. EOM are normal.  Neck: Normal range of motion. Neck supple.  Cardiovascular: Normal rate and regular rhythm.  Exam reveals no gallop and no friction rub.   No murmur heard. Pulmonary/Chest: Effort normal and breath sounds normal. No respiratory distress. He has no  wheezes. He has no rales. He exhibits no tenderness.  Abdominal: Soft. Bowel sounds are normal. There is no tenderness. There is no rebound and no guarding.  Musculoskeletal: Normal range of motion. He exhibits tenderness.  Patient with redness mostly concentrated on the dorsum of the right wrist. There is swelling and redness into the forearm and then red streaks up the lateral surface of the arm. No definite axillary lymphadenopathy. Good distal cap refill. 2+ bilateral lower extremity pitting edema.  Neurological: He is alert and oriented to person,  place, and time.  Moving all extremities without focal deficit. Sensation fully intact.  Skin: Skin is warm and dry. Capillary refill takes less than 2 seconds. No rash noted. There is erythema.  Psychiatric: He has a normal mood and affect. His behavior is normal.  Nursing note and vitals reviewed.    ED Treatments / Results  Labs (all labs ordered are listed, but only abnormal results are displayed) Labs Reviewed  CBC WITH DIFFERENTIAL/PLATELET - Abnormal; Notable for the following:       Result Value   RBC 4.15 (*)    Hemoglobin 12.8 (*)    Platelets 96 (*)    Monocytes Absolute 1.5 (*)    All other components within normal limits  BASIC METABOLIC PANEL - Abnormal; Notable for the following:    Chloride 99 (*)    Glucose, Bld 104 (*)    All other components within normal limits    EKG  EKG Interpretation None       Radiology US Venous Img Upper Uni Left  Result Date: 09/19/2017 CLINICAL DATA:  Right upper extremity edema for the past 3 days. Evaluate for DVT. EXAM: RIGHT UPPER EXTREMITY VENOUS DOPPLER ULTRASOUND TECHNIQUE: Gray-scale sonography with graded compression, as well as color Doppler and duplex ultrasound were performed to evaluate the upper extremity deep venous system from the level of the subclavian vein and including the jugular, axillary, basilic, radial, ulnar and upper cephalic vein. Spectral Doppler was utilized to evaluate flow at rest and with distal augmentation maneuvers. COMPARISON:  None. FINDINGS: Contralateral Subclavian Vein: Respiratory phasicity is normal and symmetric with the symptomatic side. No evidence of thrombus. Normal compressibility. Internal Jugular Vein: No evidence of thrombus. Normal compressibility, respiratory phasicity and response to augmentation. Subclavian Vein: No evidence of thrombus. Normal compressibility, respiratory phasicity and response to augmentation. Axillary Vein: No evidence of thrombus. Normal compressibility,  respiratory phasicity and response to augmentation. Cephalic Vein: No evidence of thrombus. Normal compressibility, respiratory phasicity and response to augmentation. Basilic Vein: No evidence of thrombus. Normal compressibility, respiratory phasicity and response to augmentation. Brachial Veins: No evidence of thrombus. Normal compressibility, respiratory phasicity and response to augmentation. Radial Veins: No evidence of thrombus. Normal compressibility, respiratory phasicity and response to augmentation. Ulnar Veins: No evidence of thrombus. Normal compressibility, respiratory phasicity and response to augmentation. Venous Reflux:  None visualized. Other Findings: A minimal subcutaneous edema is noted at the level of the forearm. IMPRESSION: No evidence of DVT within the right upper extremity. Electronically Signed   By: Simonne Come M.D.   On: 09/19/2017 17:16    Procedures Procedures (including critical care time)  Medications Ordered in ED Medications  vancomycin (VANCOCIN) IVPB 1000 mg/200 mL premix (1,000 mg Intravenous New Bag/Given 09/19/17 1717)     Initial Impression / Assessment and Plan / ED Course  I have reviewed the triage vital signs and the nursing notes.  Pertinent labs & imaging results that were available during my care  of the patient were reviewed by me and considered in my medical decision making (see chart for details).     Initiated IV vancomycin. Ultrasound without evidence of blood clot. Discussed with hospitalist and will admit.  Final Clinical Impressions(s) / ED Diagnoses   Final diagnoses:  Cellulitis of right arm    New Prescriptions New Prescriptions   No medications on file     Loren Racer, MD 09/19/17 1746

## 2017-09-19 NOTE — ED Triage Notes (Signed)
Right arm redness and swelling x 3 days.

## 2017-09-20 ENCOUNTER — Encounter (HOSPITAL_COMMUNITY): Payer: Self-pay

## 2017-09-20 DIAGNOSIS — L03113 Cellulitis of right upper limb: Secondary | ICD-10-CM | POA: Diagnosis not present

## 2017-09-20 DIAGNOSIS — D696 Thrombocytopenia, unspecified: Secondary | ICD-10-CM | POA: Diagnosis not present

## 2017-09-20 LAB — BASIC METABOLIC PANEL
Anion gap: 8 (ref 5–15)
BUN: 16 mg/dL (ref 6–20)
CHLORIDE: 100 mmol/L — AB (ref 101–111)
CO2: 27 mmol/L (ref 22–32)
CREATININE: 0.77 mg/dL (ref 0.61–1.24)
Calcium: 8.3 mg/dL — ABNORMAL LOW (ref 8.9–10.3)
GFR calc Af Amer: >60 mL/min (ref >60)
GLUCOSE: 100 mg/dL — AB (ref 65–99)
Potassium: 3.7 mmol/L (ref 3.5–5.1)
SODIUM: 135 mmol/L (ref 135–145)

## 2017-09-20 LAB — CBC
HCT: 35.2 % — ABNORMAL LOW (ref 39.0–52.0)
Hemoglobin: 11.8 g/dL — ABNORMAL LOW (ref 13.0–17.0)
MCH: 31.4 pg (ref 26.0–34.0)
MCHC: 33.5 g/dL (ref 30.0–36.0)
MCV: 93.6 fL (ref 78.0–100.0)
PLATELETS: 95 10*3/uL — AB (ref 150–400)
RBC: 3.76 MIL/uL — ABNORMAL LOW (ref 4.22–5.81)
RDW: 12.3 % (ref 11.5–15.5)
WBC: 7.1 10*3/uL (ref 4.0–10.5)

## 2017-09-20 LAB — C-REACTIVE PROTEIN: CRP: 7.7 mg/dL — AB (ref <1.0)

## 2017-09-20 MED ORDER — NYSTATIN 100000 UNIT/GM EX CREA
TOPICAL_CREAM | Freq: Two times a day (BID) | CUTANEOUS | Status: DC
  Filled 2017-09-20: qty 15

## 2017-09-20 NOTE — Progress Notes (Signed)
PROGRESS NOTE                                                                                                                                                                                                             Patient Demographics:    Randy Edwards, is a 83 y.o. male, DOB - 10-02-1930, ZOX:[id number]  Admit date - 09/19/2017   Admitting Physician Lorretta Harp, MD  Outpatient Primary MD for the patient is System, Provider Not In  LOS - 0  Outpatient Specialists:  Chief Complaint  Patient presents with  . Arm Injury       Brief Narrative  None 83 year old male with CAD with history of CABG, hyperlipidemia presented with right arm swelling and erythema of 3 days duration suggestive of cellulitis and placed on observation for IV antibiotics.   Subjective:   Denies any pain in his right arm. Feels his erythema and swelling have improved since admission.   Assessment  & Plan :    Principal Problem:   Cellulitis of right arm No clear etiology. Denies any insect bite or trauma. Doppler upper extremity negative for DVT. Continue IV vancomycin keep arm elevated. Follow cultures.  Hypertension Stable. Continue beta blocker.  Coronary artery disease with history of CABG Stable. Continue aspirin, statin and beta blocker.  Chronic thrombocytopenia. Check peripheral smear and LDH.      Code Status : Full code  Family Communication  : wife at bedside  Disposition Plan  : home possibly tomorrow if stable  Barriers For Discharge : active symptoms  Consults  : none  Procedures  : US doppler RUE  DVT Prophylaxis  :  SCDs  Lab Results  Component Value Date   PLT 95 (L) 09/20/2017    Antibiotics  :    Anti-infectives    Start     Dose/Rate Route Frequency Ordered Stop   09/20/17 1900  vancomycin (VANCOCIN) 1,500 mg in sodium chloride 0.9 % 500 mL IVPB     1,500 mg 250 mL/hr over 120 Minutes  Intravenous Every 24 hours 09/19/17 1845     09/19/17 1845  vancomycin (VANCOCIN) IVPB 1000 mg/200 mL premix     1,000 mg 200 mL/hr over 60 Minutes Intravenous  Once 09/19/17 1845 09/19/17 2100   09/19/17 1645  vancomycin (VANCOCIN) IVPB 1000 mg/200 mL premix  1,000 mg 200 mL/hr over 60 Minutes Intravenous  Once 09/19/17 1630 09/19/17 1820        Objective:   Vitals:   09/19/17 1812 09/19/17 2044 09/19/17 2125 09/20/17 0400  BP:  109/61 (!) 149/63 (!) 115/55  Pulse: 69 60 64 63  Resp:  Temp:  98.7 F (37.1 C) 98.6 F (37 C) 98.8 F (37.1 C)  TempSrc:  Oral Oral Oral  SpO2: 97% 99% 99% 97%  Weight:   94.3 kg (207 lb 14.4 oz)   Height:    (1.778 m)     Wt Readings from Last 3 Encounters:  09/19/17 94.3 kg (207 lb 14.4 oz)  05/09/13 96.2 kg (212 lb)     Intake/Output Summary (Last 24 hours) at 09/20/17 1050 Last data filed at 09/19/17 2100  Gross per 24 hour  Intake              440 ml  Output                0 ml  Net              440 ml     Physical Exam  Gen: not in distress HEENT: moist mucosa, supple neck Chest: clear b/l, no added sounds CVS: N S1&S2, no murmur GI: soft, NT, ND Musculoskeletal: Swelling of left forearm extending to the elbow with erythema and some warmth, normal range of motion and nontender (including joints)     Data Review:    CBC  Recent Labs Lab 09/19/17 1351 09/20/17 0610  WBC 7.5 7.1  HGB 12.8* 11.8*  HCT 39.1 35.2*  PLT 96* 95*  MCV 94.2 93.6  MCH 30.8 31.4  MCHC 32.7 33.5  RDW 12.4 12.3  LYMPHSABS 1.5  --   MONOABS 1.5*  --   EOSABS 0.1  --   BASOSABS 0.0  --     Chemistries   Recent Labs Lab 09/19/17 1351 09/20/17 0610  NA 136 135  K 4.4 3.7  CL 99* 100*  CO2 30 27  GLUCOSE 104* 100*  BUN 20 16  CREATININE 0.99 0.77  CALCIUM 9.1 8.3*   ------------------------------------------------------------------------------------------------------------------ No results for input(s): CHOL,  HDL, LDLCALC, TRIG, CHOLHDL, LDLDIRECT in the last 72 hours.  No results found for: HGBA1C ------------------------------------------------------------------------------------------------------------------ No results for input(s): TSH, T4TOTAL, T3FREE, THYROIDAB in the last 72 hours.  Invalid input(s): FREET3 ------------------------------------------------------------------------------------------------------------------ No results for input(s): VITAMINB12, FOLATE, FERRITIN, TIBC, IRON, RETICCTPCT in the last 72 hours.  Coagulation profile No results for input(s): INR, PROTIME in the last 168 hours.  No results for input(s): DDIMER in the last 72 hours.  Cardiac Enzymes No results for input(s): CKMB, TROPONINI, MYOGLOBIN in the last 168 hours.  Invalid input(s): CK ------------------------------------------------------------------------------------------------------------------ No results found for: BNP  Inpatient Medications  Scheduled Meds: . aspirin EC  81 mg Oral Daily  . calcium carbonate  1,250 mg Oral Daily  . cholecalciferol  1,000 Units Oral Daily  . metoprolol tartrate  50 mg Oral BID  . multivitamin with minerals  1 tablet Oral Daily  . nystatin cream   Topical BID  . simvastatin  20 mg Oral q1800   Continuous Infusions: . vancomycin     PRN Meds:.acetaminophen **OR** acetaminophen, hydrALAZINE, ondansetron (ZOFRAN) IV, oxyCODONE-acetaminophen, tetrahydrozoline, zolpidem  Micro Results Recent Results (from the past 240 hour(s))  Culture, blood (Routine X 2) w Reflex to ID Panel     Status: None (Preliminary result)  Collection Time: 09/19/17  6:40 PM  Result Value Ref Range Status   Specimen Description RIGHT ANTECUBITAL  Final   Special Requests   Final    BOTTLES DRAWN AEROBIC AND ANAEROBIC Blood Culture adequate volume   Culture PENDING  Incomplete   Report Status PENDING  Incomplete  Culture, blood (Routine X 2) w Reflex to ID Panel     Status: None  (Preliminary result)   Collection Time: 09/19/17  6:50 PM  Result Value Ref Range Status   Specimen Description LEFT ANTECUBITAL  Final   Special Requests   Final    BOTTLES DRAWN AEROBIC AND ANAEROBIC Blood Culture adequate volume   Culture PENDING  Incomplete   Report Status PENDING  Incomplete    Radiology Reports US Venous Img Upper Uni Left  Result Date: 09/19/2017 CLINICAL DATA:  Right upper extremity edema for the past 3 days. Evaluate for DVT. EXAM: RIGHT UPPER EXTREMITY VENOUS DOPPLER ULTRASOUND TECHNIQUE: Gray-scale sonography with graded compression, as well as color Doppler and duplex ultrasound were performed to evaluate the upper extremity deep venous system from the level of the subclavian vein and including the jugular, axillary, basilic, radial, ulnar and upper cephalic vein. Spectral Doppler was utilized to evaluate flow at rest and with distal augmentation maneuvers. COMPARISON:  None. FINDINGS: Contralateral Subclavian Vein: Respiratory phasicity is normal and symmetric with the symptomatic side. No evidence of thrombus. Normal compressibility. Internal Jugular Vein: No evidence of thrombus. Normal compressibility, respiratory phasicity and response to augmentation. Subclavian Vein: No evidence of thrombus. Normal compressibility, respiratory phasicity and response to augmentation. Axillary Vein: No evidence of thrombus. Normal compressibility, respiratory phasicity and response to augmentation. Cephalic Vein: No evidence of thrombus. Normal compressibility, respiratory phasicity and response to augmentation. Basilic Vein: No evidence of thrombus. Normal compressibility, respiratory phasicity and response to augmentation. Brachial Veins: No evidence of thrombus. Normal compressibility, respiratory phasicity and response to augmentation. Radial Veins: No evidence of thrombus. Normal compressibility, respiratory phasicity and response to augmentation. Ulnar Veins: No evidence of  thrombus. Normal compressibility, respiratory phasicity and response to augmentation. Venous Reflux:  None visualized. Other Findings: A minimal subcutaneous edema is noted at the level of the forearm. IMPRESSION: No evidence of DVT within the right upper extremity. Electronically Signed   By: Simonne Come M.D.   On: 09/19/2017 17:16    Time Spent in minutes  25   Eddie North M.D on 09/20/2017 at 10:50 AM  Between 7am to 7pm - Pager - [phone]  After 7pm go to [email or web address] - password [facility]  [facility] -  Office  [phone]

## 2017-09-21 DIAGNOSIS — D696 Thrombocytopenia, unspecified: Secondary | ICD-10-CM | POA: Diagnosis not present

## 2017-09-21 DIAGNOSIS — L03113 Cellulitis of right upper limb: Secondary | ICD-10-CM | POA: Diagnosis not present

## 2017-09-21 NOTE — Progress Notes (Signed)
PROGRESS NOTE                                                                                                                                                                                                             Patient Demographics:    Randy Edwards, is a 83 y.o. male, DOB - 02/23/1930, ZOX:[id number]  Admit date - 09/19/2017   Admitting Physician Lorretta Harp, MD  Outpatient Primary MD for the patient is System, Provider Not In  LOS - 0  Outpatient Specialists:  Chief Complaint  Patient presents with  . Arm Injury       Brief Narrative  None 83 year old male with CAD with history of CABG, hyperlipidemia presented with right arm swelling and erythema of 3 days duration suggestive of cellulitis and placed on observation for IV antibiotics.   Subjective:   Right him swelling and erythema mildly improved but not significant. Denies any pain.   Assessment  & Plan :    Principal Problem:   Cellulitis of right arm No clear etiology. Denies any insect bite or trauma. Doppler upper extremity negative for DVT. Continue IV vancomycin keep arm elevated. will need another day of IV antibiotics symptoms only moderately improved. Cultures negative.  Hypertension Stable. Continue beta blocker.  Coronary artery disease with history of CABG Stable. Continue aspirin, statin and beta blocker.  Chronic thrombocytopenia. Check peripheral smear and LDH.      Code Status : Full code  Family Communication  : wife at bedside  Disposition Plan  : home in a.m. If improved  Barriers For Discharge : active symptoms  Consults  : none  Procedures  : US doppler RUE  DVT Prophylaxis  :  SCDs  Lab Results  Component Value Date   PLT 95 (L) 09/20/2017    Antibiotics  :    Anti-infectives    Start     Dose/Rate Route Frequency Ordered Stop   09/20/17 1900  vancomycin (VANCOCIN) 1,500 mg in sodium chloride 0.9 % 500  mL IVPB     1,500 mg 250 mL/hr over 120 Minutes Intravenous Every 24 hours 09/19/17 1845     09/19/17 1845  vancomycin (VANCOCIN) IVPB 1000 mg/200 mL premix     1,000 mg 200 mL/hr over 60 Minutes Intravenous  Once 09/19/17 1845 09/19/17 2100   09/19/17 1645  vancomycin (  VANCOCIN) IVPB 1000 mg/200 mL premix     1,000 mg 200 mL/hr over 60 Minutes Intravenous  Once 09/19/17 1630 09/19/17 1820        Objective:   Vitals:   09/20/17 0400 09/20/17 1300 09/20/17 2002 09/21/17 0527  BP: (!) 115/55 130/83 132/65 116/61  Pulse: 63 64 65 (!) 57  Resp: Temp: 98.8 F (37.1 C) 97.7 F (36.5 C) 97.7 F (36.5 C) 97.7 F (36.5 C)  TempSrc: Oral Oral Oral Oral  SpO2: 97% 91% 98% 97%  Weight:      Height:        Wt Readings from Last 3 Encounters:  09/19/17 94.3 kg (207 lb 14.4 oz)  05/09/13 96.2 kg (212 lb)     Intake/Output Summary (Last 24 hours) at 09/21/17 1129 Last data filed at 09/21/17 5409  Gross per 24 hour  Intake             1770 ml  Output              900 ml  Net              870 ml     Physical Exam Gen: NAD HEENT: moist mucosa, supple neck Chest: Clear bilaterally  CVS: Normal S1 and S2, no murmurs GI: Soft, nondistended, nontender Musculoskeletal: Swelling of the left forearm with edema involving lower right forearm and the elbow. No tenderness or fluctuation      Data Review:    CBC  Recent Labs Lab 09/19/17 1351 09/20/17 0610  WBC 7.5 7.1  HGB 12.8* 11.8*  HCT 39.1 35.2*  PLT 96* 95*  MCV 94.2 93.6  MCH 30.8 31.4  MCHC 32.7 33.5  RDW 12.4 12.3  LYMPHSABS 1.5  --   MONOABS 1.5*  --   EOSABS 0.1  --   BASOSABS 0.0  --     Chemistries   Recent Labs Lab 09/19/17 1351 09/20/17 0610  NA 136 135  K 4.4 3.7  CL 99* 100*  CO2 30 27  GLUCOSE 104* 100*  BUN 20 16  CREATININE 0.99 0.77  CALCIUM 9.1 8.3*   ------------------------------------------------------------------------------------------------------------------ No  results for input(s): CHOL, HDL, LDLCALC, TRIG, CHOLHDL, LDLDIRECT in the last 72 hours.  No results found for: HGBA1C ------------------------------------------------------------------------------------------------------------------ No results for input(s): TSH, T4TOTAL, T3FREE, THYROIDAB in the last 72 hours.  Invalid input(s): FREET3 ------------------------------------------------------------------------------------------------------------------ No results for input(s): VITAMINB12, FOLATE, FERRITIN, TIBC, IRON, RETICCTPCT in the last 72 hours.  Coagulation profile No results for input(s): INR, PROTIME in the last 168 hours.  No results for input(s): DDIMER in the last 72 hours.  Cardiac Enzymes No results for input(s): CKMB, TROPONINI, MYOGLOBIN in the last 168 hours.  Invalid input(s): CK ------------------------------------------------------------------------------------------------------------------ No results found for: BNP  Inpatient Medications  Scheduled Meds: . aspirin EC  81 mg Oral Daily  . calcium carbonate  1,250 mg Oral Daily  . cholecalciferol  1,000 Units Oral Daily  . metoprolol tartrate  50 mg Oral BID  . multivitamin with minerals  1 tablet Oral Daily  . simvastatin  20 mg Oral q1800   Continuous Infusions: . vancomycin Stopped (09/20/17 2010)   PRN Meds:.acetaminophen **OR** acetaminophen, hydrALAZINE, ondansetron (ZOFRAN) IV, oxyCODONE-acetaminophen, tetrahydrozoline, zolpidem  Micro Results Recent Results (from the past 240 hour(s))  Culture, blood (Routine X 2) w Reflex to ID Panel     Status: None (Preliminary result)   Collection Time: 09/19/17  6:40 PM  Result Value Ref  Range Status   Specimen Description RIGHT ANTECUBITAL  Final   Special Requests   Final    BOTTLES DRAWN AEROBIC AND ANAEROBIC Blood Culture adequate volume   Culture NO GROWTH < 24 HOURS  Final   Report Status PENDING  Incomplete  Culture, blood (Routine X 2) w Reflex to ID  Panel     Status: None (Preliminary result)   Collection Time: 09/19/17  6:50 PM  Result Value Ref Range Status   Specimen Description LEFT ANTECUBITAL  Final   Special Requests   Final    BOTTLES DRAWN AEROBIC AND ANAEROBIC Blood Culture adequate volume   Culture NO GROWTH < 24 HOURS  Final   Report Status PENDING  Incomplete    Radiology Reports US Venous Img Upper Uni Left  Result Date: 09/19/2017 CLINICAL DATA:  Right upper extremity edema for the past 3 days. Evaluate for DVT. EXAM: RIGHT UPPER EXTREMITY VENOUS DOPPLER ULTRASOUND TECHNIQUE: Gray-scale sonography with graded compression, as well as color Doppler and duplex ultrasound were performed to evaluate the upper extremity deep venous system from the level of the subclavian vein and including the jugular, axillary, basilic, radial, ulnar and upper cephalic vein. Spectral Doppler was utilized to evaluate flow at rest and with distal augmentation maneuvers. COMPARISON:  None. FINDINGS: Contralateral Subclavian Vein: Respiratory phasicity is normal and symmetric with the symptomatic side. No evidence of thrombus. Normal compressibility. Internal Jugular Vein: No evidence of thrombus. Normal compressibility, respiratory phasicity and response to augmentation. Subclavian Vein: No evidence of thrombus. Normal compressibility, respiratory phasicity and response to augmentation. Axillary Vein: No evidence of thrombus. Normal compressibility, respiratory phasicity and response to augmentation. Cephalic Vein: No evidence of thrombus. Normal compressibility, respiratory phasicity and response to augmentation. Basilic Vein: No evidence of thrombus. Normal compressibility, respiratory phasicity and response to augmentation. Brachial Veins: No evidence of thrombus. Normal compressibility, respiratory phasicity and response to augmentation. Radial Veins: No evidence of thrombus. Normal compressibility, respiratory phasicity and response to augmentation.  Ulnar Veins: No evidence of thrombus. Normal compressibility, respiratory phasicity and response to augmentation. Venous Reflux:  None visualized. Other Findings: A minimal subcutaneous edema is noted at the level of the forearm. IMPRESSION: No evidence of DVT within the right upper extremity. Electronically Signed   By: Simonne Come M.D.   On: 09/19/2017 17:16    Time Spent in minutes  25   Eddie North M.D on 09/21/2017 at 11:29 AM  Between 7am to 7pm - Pager - [phone]  After 7pm go to [email or web address] - password [facility]  [facility] -  Office  [phone]

## 2017-09-22 DIAGNOSIS — L80 Vitiligo: Secondary | ICD-10-CM | POA: Diagnosis present

## 2017-09-22 DIAGNOSIS — I10 Essential (primary) hypertension: Secondary | ICD-10-CM | POA: Diagnosis present

## 2017-09-22 DIAGNOSIS — E785 Hyperlipidemia, unspecified: Secondary | ICD-10-CM | POA: Diagnosis present

## 2017-09-22 DIAGNOSIS — Z7982 Long term (current) use of aspirin: Secondary | ICD-10-CM | POA: Diagnosis not present

## 2017-09-22 DIAGNOSIS — L03113 Cellulitis of right upper limb: Secondary | ICD-10-CM | POA: Diagnosis present

## 2017-09-22 DIAGNOSIS — I2510 Atherosclerotic heart disease of native coronary artery without angina pectoris: Secondary | ICD-10-CM | POA: Diagnosis present

## 2017-09-22 DIAGNOSIS — Z951 Presence of aortocoronary bypass graft: Secondary | ICD-10-CM | POA: Diagnosis not present

## 2017-09-22 DIAGNOSIS — D696 Thrombocytopenia, unspecified: Secondary | ICD-10-CM | POA: Diagnosis present

## 2017-09-22 DIAGNOSIS — Z79899 Other long term (current) drug therapy: Secondary | ICD-10-CM | POA: Diagnosis not present

## 2017-09-22 LAB — CBC
HEMATOCRIT: 37.2 % — AB (ref 39.0–52.0)
HEMOGLOBIN: 12.3 g/dL — AB (ref 13.0–17.0)
MCH: 31 pg (ref 26.0–34.0)
MCHC: 33.1 g/dL (ref 30.0–36.0)
MCV: 93.7 fL (ref 78.0–100.0)
Platelets: 129 10*3/uL — ABNORMAL LOW (ref 150–400)
RBC: 3.97 MIL/uL — ABNORMAL LOW (ref 4.22–5.81)
RDW: 12.3 % (ref 11.5–15.5)
WBC: 6.3 10*3/uL (ref 4.0–10.5)

## 2017-09-22 LAB — PATHOLOGIST SMEAR REVIEW

## 2017-09-22 MED ORDER — DOXYCYCLINE HYCLATE 100 MG PO TABS: 100.0000 mg | ORAL_TABLET | Freq: Two times a day (BID) | ORAL | 0 refills | Status: AC

## 2017-09-22 NOTE — Discharge Instructions (Signed)

## 2017-09-22 NOTE — Care Management Important Message (Signed)
Important Message  Patient Details  Name: Randy Edwards MRN: [id number] Date of Birth: Dec 24, 1929   Medicare Important Message Given:  Yes    Pradeep Beaubrun, Chrystine Oiler, RN 09/22/2017, 9:51 AM

## 2017-09-22 NOTE — Progress Notes (Signed)
NURSING PROGRESS NOTE  Randy Edwards [id number] Discharge Data: 09/22/2017 1:08 PM Attending Provider: Eddie North, MD [email or web address], Sid Falcon, MD     Hessie Diener to be D/C'd Home per MD order.  Discussed with the patient the After Visit Summary and all questions fully answered. All IV's discontinued with no bleeding noted. All belongings returned to patient for patient to take home. Patient is being sent home with a printed prescription for doxycycline.  Last Vital Signs:  Blood pressure (!) 107/58, pulse (!) 59, temperature 97.6 F (36.4 C), temperature source Oral, resp. rate 20, height  (1.778 m), weight 94.3 kg (207 lb 14.4 oz), SpO2 98 %.  Discharge Medication List Allergies as of 09/22/2017   No Known Allergies     Medication List    TAKE these medications   aspirin EC 81 MG tablet Take 81 mg by mouth daily.   calcium carbonate 600 MG Tabs tablet Commonly known as:  OS-CAL Take 600 mg by mouth daily.   doxycycline 100 MG tablet Commonly known as:  VIBRA-TABS Take 1 tablet (100 mg total) by mouth 2 (two) times daily.   metoprolol tartrate 50 MG tablet Commonly known as:  LOPRESSOR Take 50 mg by mouth 2 (two) times daily.   multivitamin with minerals Tabs tablet Take 1 tablet by mouth daily.   simvastatin 20 MG tablet Commonly known as:  ZOCOR Take 1 tablet by mouth.   VISINE OP Apply 1 drop to eye daily as needed (eye irritation).   VITAMIN D PO Take 1 tablet by mouth.

## 2017-09-22 NOTE — Discharge Summary (Signed)
Physician Discharge Summary  Elishua Radford ZOX:[id number] DOB: 1930-07-20 DOA: 09/19/2017  PCP: Elise Benne, MD  Admit date: 09/19/2017 Discharge date: 09/22/2017  Admitted From: home Disposition: home  Recommendations for Outpatient Follow-up:  1. Follow up with PCP in 1-2 weeks 2. Patient will complete total ten-day course of antibiotics on 09/30/2017.  Home Health:none Equipment/Devices:none  Discharge Condition: fair CODE STATUS:full code Diet recommendation: Heart Healthy     Discharge Diagnoses:  Principal Problem:   Cellulitis of right arm   Active Problems:   Hyperlipidemia   Hypertension   CAD (coronary artery disease)   Thrombocytopenia (HCC)  Brief narrative suggests history of present illness Please refer to admission H&P for details, in brief, 83 year old male with CAD with history of CABG, hyperlipidemia presented with right arm swelling and erythema of 3 days duration suggestive of cellulitis and placed on observation for IV antibiotics.  Hospital course  Principal Problem:   Cellulitis of right arm No clear etiology. Denies any insect bite or trauma. Doppler upper extremity negative for DVT. Blood cultures negative. received IV vancomycin with improvement. Remains afebrile. I will discharge him on oral doxycycline to complete a ten-day course of antibiotics.   Essential Hypertension Stable. Continue beta blocker.  Coronary artery disease with history of CABG Stable. Continue aspirin, statin and beta blocker.  Chronic thrombocytopenia. Noted for low platelets possibly due to infection. Improved to 129 today. Outpatient follow-up.      Code Status : Full code  Family Communication  : wife at bedside  Disposition Plan  : home   Consults  : none  Procedures  : US doppler RUE   Discharge Instructions   Allergies as of 09/22/2017   No Known Allergies     Medication List    TAKE these medications   aspirin EC 81  MG tablet Take 81 mg by mouth daily.   calcium carbonate 600 MG Tabs tablet Commonly known as:  OS-CAL Take 600 mg by mouth daily.   doxycycline 100 MG tablet Commonly known as:  VIBRA-TABS Take 1 tablet (100 mg total) by mouth 2 (two) times daily.   metoprolol tartrate 50 MG tablet Commonly known as:  LOPRESSOR Take 50 mg by mouth 2 (two) times daily.   multivitamin with minerals Tabs tablet Take 1 tablet by mouth daily.   simvastatin 20 MG tablet Commonly known as:  ZOCOR Take 1 tablet by mouth.   VISINE OP Apply 1 drop to eye daily as needed (eye irritation).   VITAMIN D PO Take 1 tablet by mouth.      Follow-up Information    Pradhan, Sid Falcon, MD. Schedule an appointment as soon as possible for a visit in 1 week(s).   Specialty:  Internal Medicine Contact information: [address] [place] [address] [phone]          No Known Allergies    Procedures/Studies: US Venous Img Upper Uni Left  Result Date: 09/19/2017 CLINICAL DATA:  Right upper extremity edema for the past 3 days. Evaluate for DVT. EXAM: RIGHT UPPER EXTREMITY VENOUS DOPPLER ULTRASOUND TECHNIQUE: Gray-scale sonography with graded compression, as well as color Doppler and duplex ultrasound were performed to evaluate the upper extremity deep venous system from the level of the subclavian vein and including the jugular, axillary, basilic, radial, ulnar and upper cephalic vein. Spectral Doppler was utilized to evaluate flow at rest and with distal augmentation maneuvers. COMPARISON:  None. FINDINGS: Contralateral Subclavian Vein: Respiratory phasicity is normal and symmetric with the symptomatic  side. No evidence of thrombus. Normal compressibility. Internal Jugular Vein: No evidence of thrombus. Normal compressibility, respiratory phasicity and response to augmentation. Subclavian Vein: No evidence of thrombus. Normal compressibility, respiratory phasicity and response to augmentation. Axillary Vein:  No evidence of thrombus. Normal compressibility, respiratory phasicity and response to augmentation. Cephalic Vein: No evidence of thrombus. Normal compressibility, respiratory phasicity and response to augmentation. Basilic Vein: No evidence of thrombus. Normal compressibility, respiratory phasicity and response to augmentation. Brachial Veins: No evidence of thrombus. Normal compressibility, respiratory phasicity and response to augmentation. Radial Veins: No evidence of thrombus. Normal compressibility, respiratory phasicity and response to augmentation. Ulnar Veins: No evidence of thrombus. Normal compressibility, respiratory phasicity and response to augmentation. Venous Reflux:  None visualized. Other Findings: A minimal subcutaneous edema is noted at the level of the forearm. IMPRESSION: No evidence of DVT within the right upper extremity. Electronically Signed   By: Simonne Come M.D.   On: 09/19/2017 17:16       Subjective: Right arm swelling and erythema much improved. Denies any pain.  Discharge Exam: Vitals:   09/21/17 2024 09/22/17 0522  BP: (!) 116/56 (!) 107/58  Pulse: 63 (!) 59  Resp: 20 20  Temp: 97.8 F (36.6 C) 97.6 F (36.4 C)  SpO2: 97% 98%   Vitals:   09/21/17 0527 09/21/17 1412 09/21/17 2024 09/22/17 0522  BP: 116/61 136/66 (!) 116/56 (!) 107/58  Pulse: (!) 57 63 63 (!) 59  Resp: Temp: 97.7 F (36.5 C) (!) 97.5 F (36.4 C) 97.8 F (36.6 C) 97.6 F (36.4 C)  TempSrc: Oral Oral Oral Oral  SpO2: 97% 98% 97% 98%  Weight:      Height:        Gen: NAD HEENT: moist mucosa, supple neck Chest: Clear bilaterally  CVS: Normal S1 and S2, no murmurs GI: Soft, nondistended, nontender Musculoskeletal: Swelling of the left forearm markedly improved with small area of erythema over the right wrist and the elbow. No warmth,tender movement or fluctuation.     The results of significant diagnostics from this hospitalization (including imaging,  microbiology, ancillary and laboratory) are listed below for reference.     Microbiology: Recent Results (from the past 240 hour(s))  Culture, blood (Routine X 2) w Reflex to ID Panel     Status: None (Preliminary result)   Collection Time: 09/19/17  6:40 PM  Result Value Ref Range Status   Specimen Description RIGHT ANTECUBITAL  Final   Special Requests   Final    BOTTLES DRAWN AEROBIC AND ANAEROBIC Blood Culture adequate volume   Culture NO GROWTH 3 DAYS  Final   Report Status PENDING  Incomplete  Culture, blood (Routine X 2) w Reflex to ID Panel     Status: None (Preliminary result)   Collection Time: 09/19/17  6:50 PM  Result Value Ref Range Status   Specimen Description LEFT ANTECUBITAL  Final   Special Requests   Final    BOTTLES DRAWN AEROBIC AND ANAEROBIC Blood Culture adequate volume   Culture NO GROWTH 3 DAYS  Final   Report Status PENDING  Incomplete     Labs: BNP (last 3 results) No results for input(s): BNP in the last 8760 hours. Basic Metabolic Panel:  Recent Labs Lab 09/19/17 1351 09/20/17 0610  NA 136 135  K 4.4 3.7  CL 99* 100*  CO2 30 27  GLUCOSE 104* 100*  BUN 20 16  CREATININE 0.99 0.77  CALCIUM 9.1 8.3*  Liver Function Tests: No results for input(s): AST, ALT, ALKPHOS, BILITOT, PROT, ALBUMIN in the last 168 hours. No results for input(s): LIPASE, AMYLASE in the last 168 hours. No results for input(s): AMMONIA in the last 168 hours. CBC:  Recent Labs Lab 09/19/17 1351 09/20/17 0610 09/22/17 0612  WBC 7.5 7.1 6.3  NEUTROABS 4.4  --   --   HGB 12.8* 11.8* 12.3*  HCT 39.1 35.2* 37.2*  MCV 94.2 93.6 93.7  PLT 96* 95* 129*   Cardiac Enzymes: No results for input(s): CKTOTAL, CKMB, CKMBINDEX, TROPONINI in the last 168 hours. BNP: Invalid input(s): POCBNP CBG: No results for input(s): GLUCAP in the last 168 hours. D-Dimer No results for input(s): DDIMER in the last 72 hours. Hgb A1c No results for input(s): HGBA1C in the last 72  hours. Lipid Profile No results for input(s): CHOL, HDL, LDLCALC, TRIG, CHOLHDL, LDLDIRECT in the last 72 hours. Thyroid function studies No results for input(s): TSH, T4TOTAL, T3FREE, THYROIDAB in the last 72 hours.  Invalid input(s): FREET3 Anemia work up No results for input(s): VITAMINB12, FOLATE, FERRITIN, TIBC, IRON, RETICCTPCT in the last 72 hours. Urinalysis    Component Value Date/Time   COLORURINE YELLOW 05/09/2013 1019   APPEARANCEUR CLEAR 05/09/2013 1019   LABSPEC <1.005 (L) 05/09/2013 1019   PHURINE 7.0 05/09/2013 1019   GLUCOSEU NEGATIVE 05/09/2013 1019   HGBUR TRACE (A) 05/09/2013 1019   BILIRUBINUR NEGATIVE 05/09/2013 1019   KETONESUR NEGATIVE 05/09/2013 1019   PROTEINUR NEGATIVE 05/09/2013 1019   UROBILINOGEN 0.2 05/09/2013 1019   NITRITE NEGATIVE 05/09/2013 1019   LEUKOCYTESUR NEGATIVE 05/09/2013 1019   Sepsis Labs Invalid input(s): PROCALCITONIN,  WBC,  LACTICIDVEN Microbiology Recent Results (from the past 240 hour(s))  Culture, blood (Routine X 2) w Reflex to ID Panel     Status: None (Preliminary result)   Collection Time: 09/19/17  6:40 PM  Result Value Ref Range Status   Specimen Description RIGHT ANTECUBITAL  Final   Special Requests   Final    BOTTLES DRAWN AEROBIC AND ANAEROBIC Blood Culture adequate volume   Culture NO GROWTH 3 DAYS  Final   Report Status PENDING  Incomplete  Culture, blood (Routine X 2) w Reflex to ID Panel     Status: None (Preliminary result)   Collection Time: 09/19/17  6:50 PM  Result Value Ref Range Status   Specimen Description LEFT ANTECUBITAL  Final   Special Requests   Final    BOTTLES DRAWN AEROBIC AND ANAEROBIC Blood Culture adequate volume   Culture NO GROWTH 3 DAYS  Final   Report Status PENDING  Incomplete     Time coordinating discharge: < 30 minutes  SIGNED:   Eddie North, MD  [facility] 09/22/2017, 10:31 AM Pager   If 7PM-7AM, please contact night-coverage [email or web address] Password  [email or web address]

## 2017-09-24 LAB — CULTURE, BLOOD (ROUTINE X 2)
CULTURE: NO GROWTH
Culture: NO GROWTH
SPECIAL REQUESTS: ADEQUATE
SPECIAL REQUESTS: ADEQUATE

## 2018-05-11 IMAGING — US US EXTREM  UP VENOUS*L*
1 series · 13 of 24 positions shown · non-contrast
Comparison: None.

CLINICAL DATA: Right upper extremity edema for the past 3 days.
Evaluate for DVT.



[Series 1: us extrem up venous*left* · 0.08mm/px · 13 of 44 slices shown]
[im 1/44]
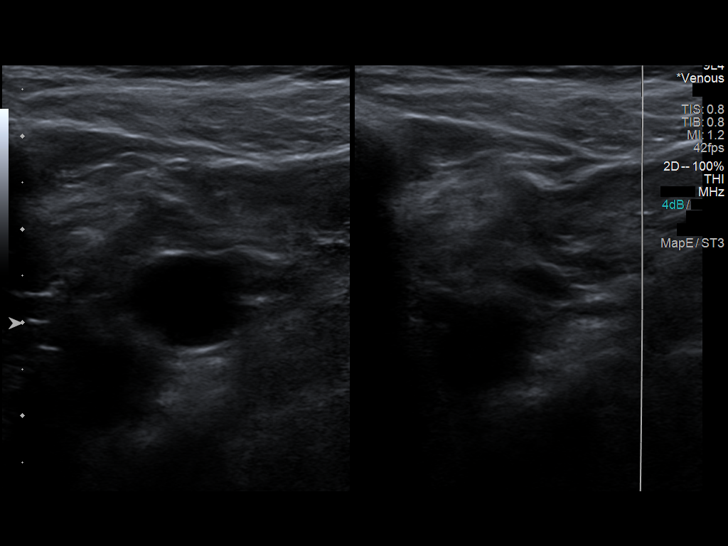
[im 4/44]
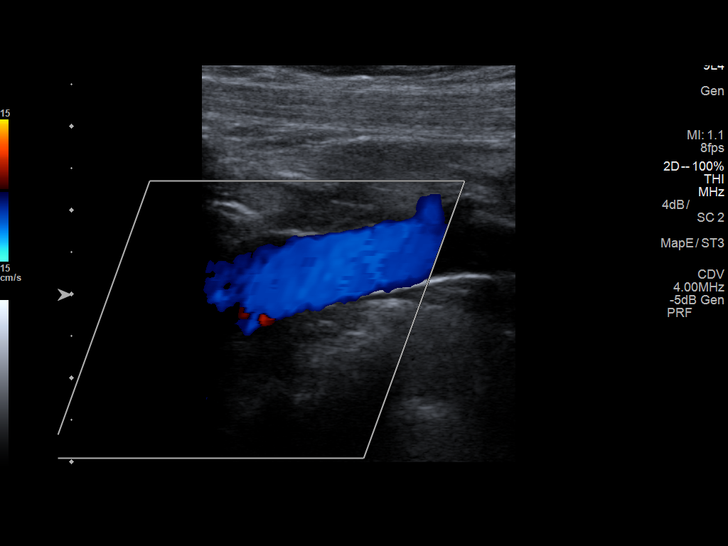
[im 8/44]
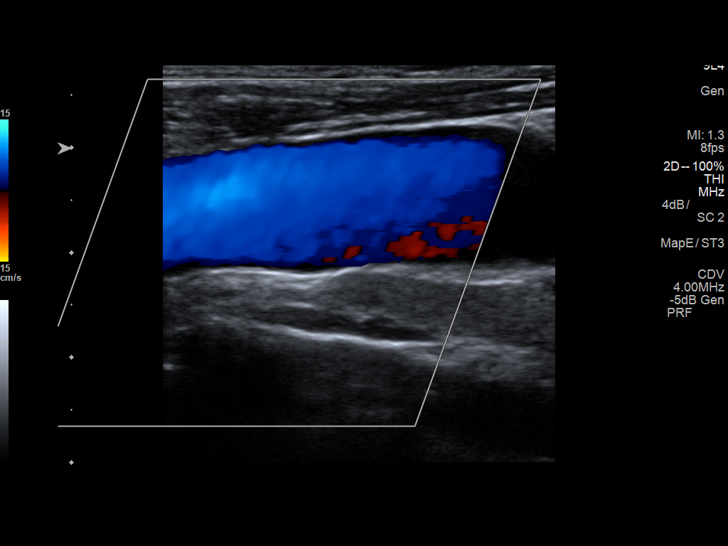
[im 12/44]
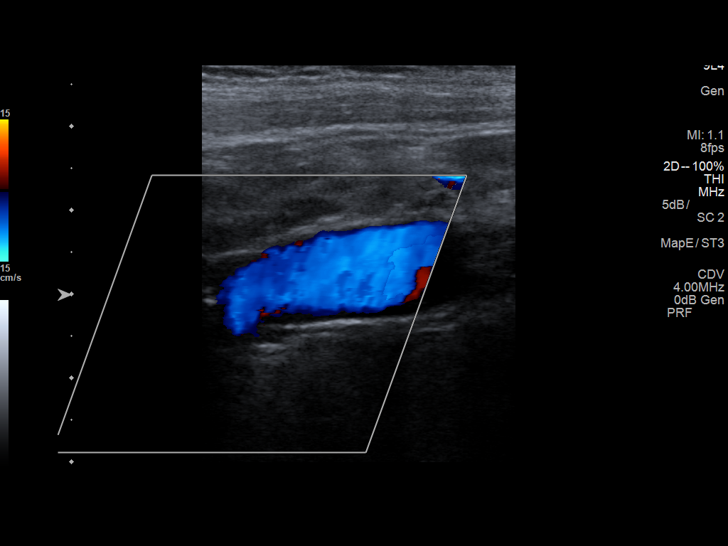
[im 15/44]
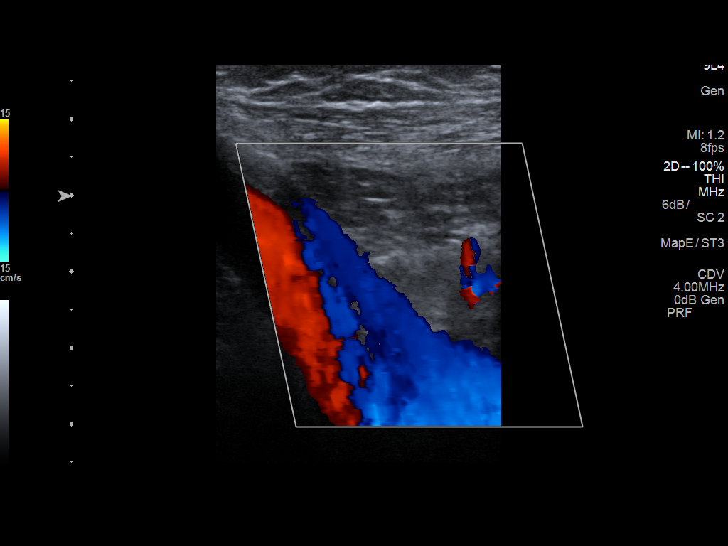
[im 19/44]
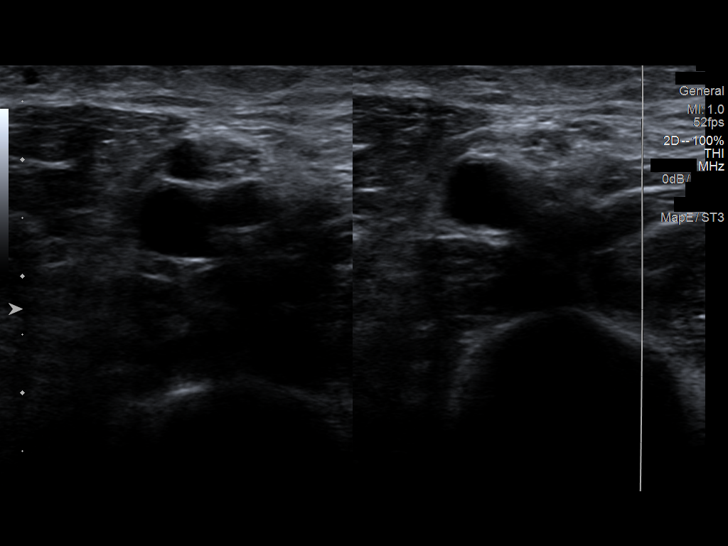
[im 23/44]
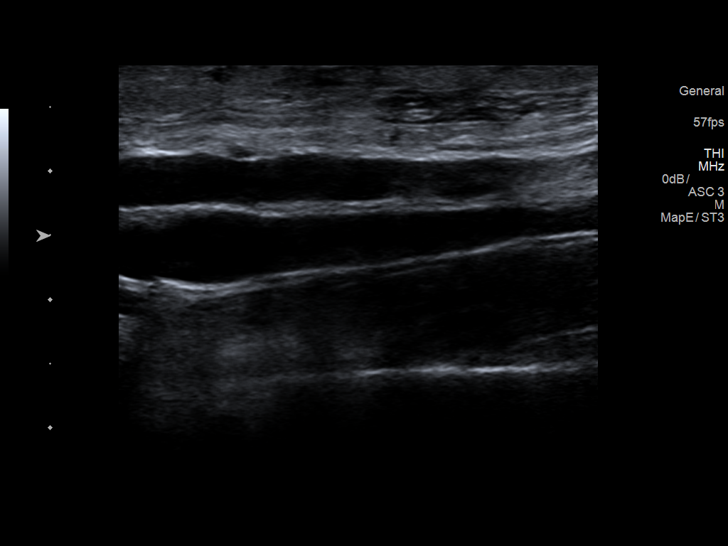
[im 25/44]
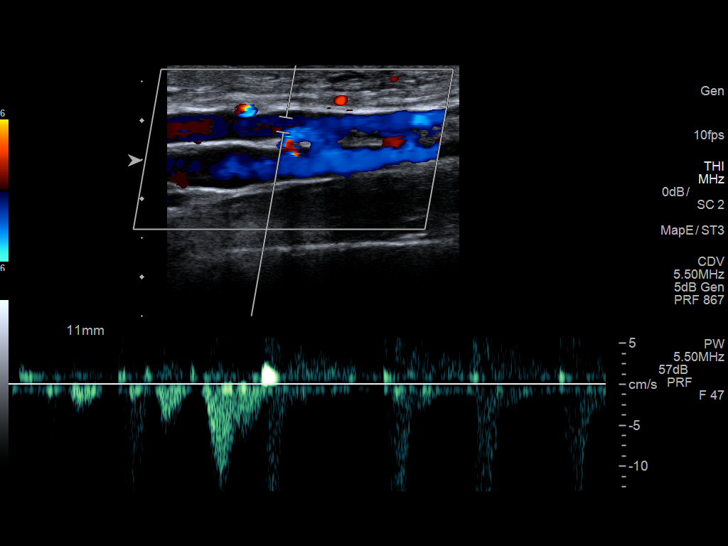
[im 29/44]
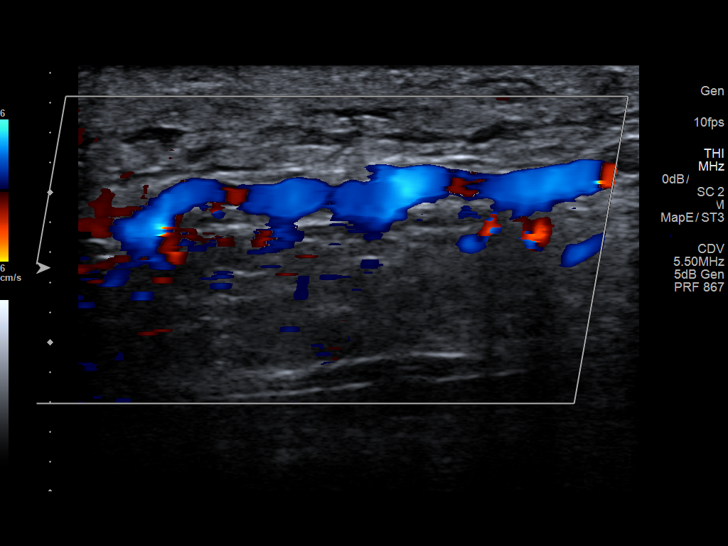
[im 32/44]
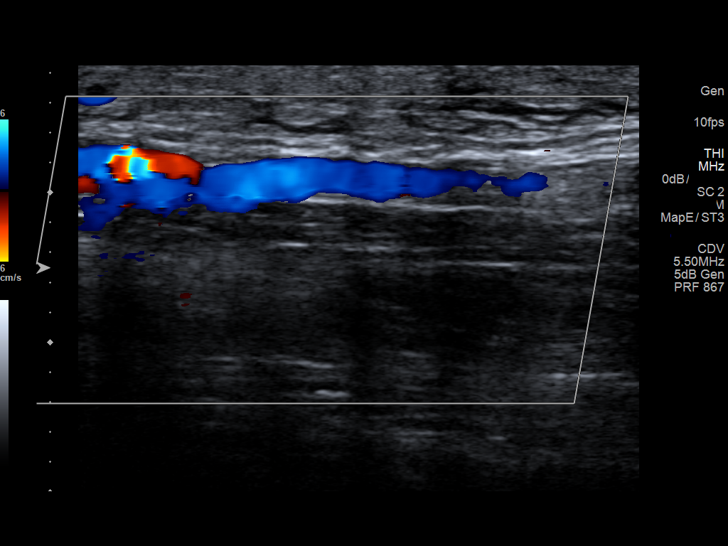
[im 36/44]
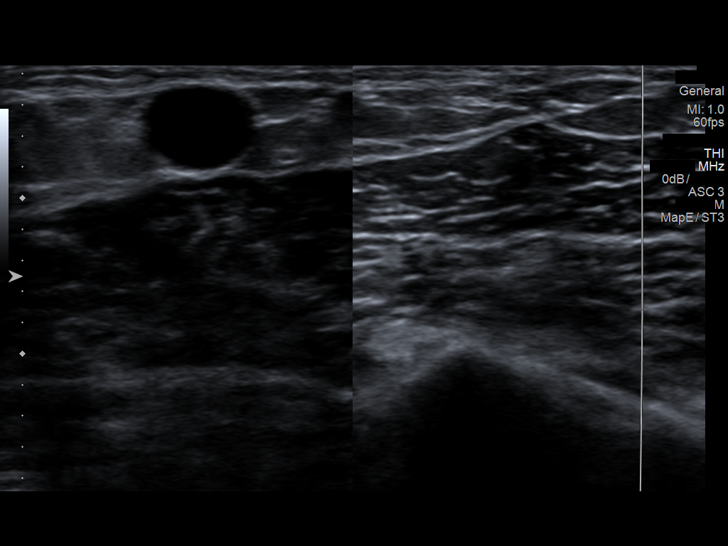
[im 40/44]
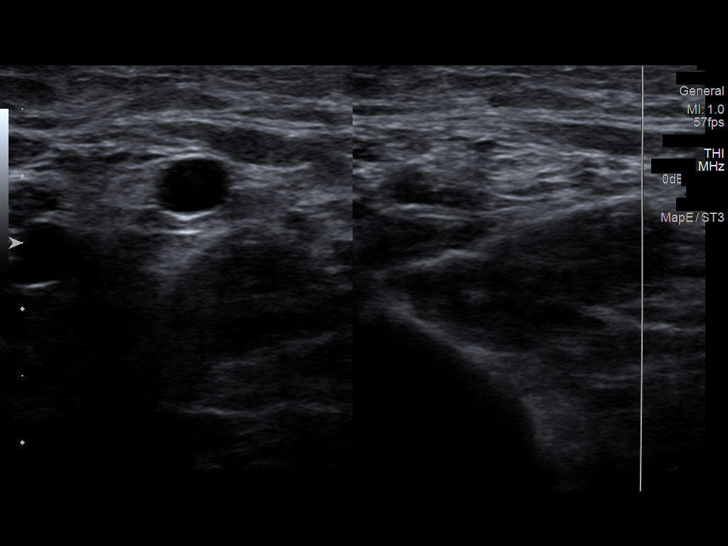
[im 44/44]
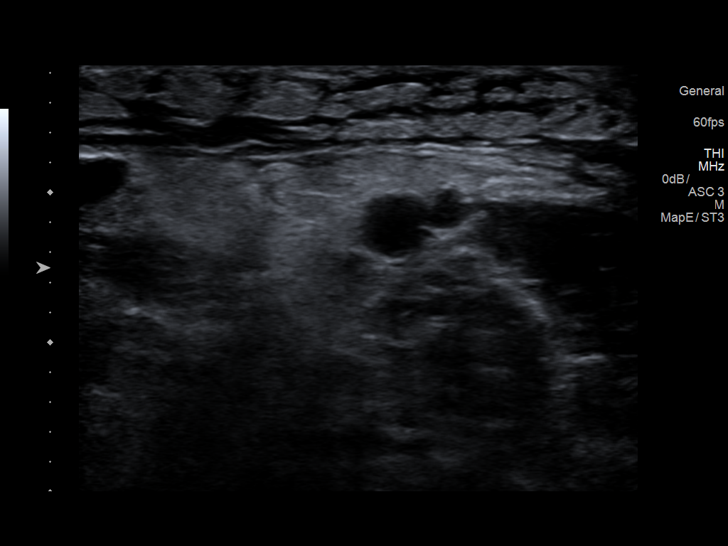

[13 of 24 positions shown; findings below may reference images not displayed]

FINDINGS: Contralateral Subclavian Vein: Respiratory phasicity is normal and
symmetric with the symptomatic side. No evidence of thrombus. Normal
compressibility.

Internal Jugular Vein: No evidence of thrombus. Normal
compressibility, respiratory phasicity and response to augmentation.

Subclavian Vein: No evidence of thrombus. Normal compressibility,
respiratory phasicity and response to augmentation.

Axillary Vein: No evidence of thrombus. Normal compressibility,
respiratory phasicity and response to augmentation.

Cephalic Vein: No evidence of thrombus. Normal compressibility,
respiratory phasicity and response to augmentation.

Basilic Vein: No evidence of thrombus. Normal compressibility,
respiratory phasicity and response to augmentation.

Brachial Veins: No evidence of thrombus. Normal compressibility,
respiratory phasicity and response to augmentation.

Radial Veins: No evidence of thrombus. Normal compressibility,
respiratory phasicity and response to augmentation.

Ulnar Veins: No evidence of thrombus. Normal compressibility,
respiratory phasicity and response to augmentation.

Venous Reflux:  None visualized.

Other Findings: A minimal subcutaneous edema is noted at the level
of the forearm.
IMPRESSION: No evidence of DVT within the right upper extremity.
